# Patient Record
Sex: Male | Born: 1975 | Race: Black or African American | Hispanic: No | Marital: Married | Smoking: Former smoker
Health system: Southern US, Community
[De-identification: ages and names within clinical notes are randomized; demographics above are authoritative.]

## PROBLEM LIST (undated history)

## (undated) DIAGNOSIS — I10 Essential (primary) hypertension: Secondary | ICD-10-CM

## (undated) DIAGNOSIS — I509 Heart failure, unspecified: Secondary | ICD-10-CM

## (undated) DIAGNOSIS — E785 Hyperlipidemia, unspecified: Secondary | ICD-10-CM

## (undated) DIAGNOSIS — I4891 Unspecified atrial fibrillation: Secondary | ICD-10-CM

## (undated) HISTORY — DX: Essential (primary) hypertension: I10

## (undated) HISTORY — PX: NO PAST SURGERIES: SHX2092

---

## 2014-02-27 ENCOUNTER — Encounter (HOSPITAL_COMMUNITY): Payer: Self-pay

## 2014-02-27 ENCOUNTER — Emergency Department (HOSPITAL_COMMUNITY)
Admission: EM | Admit: 2014-02-27 | Discharge: 2014-02-27 | Discharge status: Against Medical Advice | Payer: BLUE CROSS/BLUE SHIELD | Attending: Emergency Medicine | Admitting: Emergency Medicine

## 2014-02-27 ENCOUNTER — Emergency Department (HOSPITAL_COMMUNITY): Payer: BLUE CROSS/BLUE SHIELD

## 2014-02-27 DIAGNOSIS — R040 Epistaxis: Secondary | ICD-10-CM | POA: Insufficient documentation

## 2014-02-27 DIAGNOSIS — R0789 Other chest pain: Secondary | ICD-10-CM | POA: Diagnosis not present

## 2014-02-27 DIAGNOSIS — Z720 Tobacco use: Secondary | ICD-10-CM | POA: Diagnosis not present

## 2014-02-27 DIAGNOSIS — R002 Palpitations: Secondary | ICD-10-CM

## 2014-02-27 DIAGNOSIS — R0602 Shortness of breath: Secondary | ICD-10-CM | POA: Diagnosis not present

## 2014-02-27 LAB — CBC
HCT: 45 % (ref 39.0–52.0)
HEMOGLOBIN: 15 g/dL (ref 13.0–17.0)
MCH: 29.4 pg (ref 26.0–34.0)
MCHC: 33.3 g/dL (ref 30.0–36.0)
MCV: 88.2 fL (ref 78.0–100.0)
Platelets: 271 10*3/uL (ref 150–400)
RBC: 5.1 MIL/uL (ref 4.22–5.81)
RDW: 14.3 % (ref 11.5–15.5)
WBC: 6.8 10*3/uL (ref 4.0–10.5)

## 2014-02-27 LAB — TROPONIN I: Troponin I: <0.03 ng/mL (ref <0.031)

## 2014-02-27 LAB — BASIC METABOLIC PANEL
Anion gap: 6 (ref 5–15)
BUN: 11 mg/dL (ref 6–23)
CALCIUM: 8.6 mg/dL (ref 8.4–10.5)
CO2: 24 mmol/L (ref 19–32)
CREATININE: 1.19 mg/dL (ref 0.50–1.35)
Chloride: 108 mEq/L (ref 96–112)
GFR calc Af Amer: 89 mL/min — ABNORMAL LOW (ref >90)
GFR, EST NON AFRICAN AMERICAN: 77 mL/min — AB (ref >90)
Glucose, Bld: 97 mg/dL (ref 70–99)
Potassium: 4.4 mmol/L (ref 3.5–5.1)
Sodium: 138 mmol/L (ref 135–145)

## 2014-02-27 NOTE — ED Provider Notes (Signed)
CSN: [id number]     Arrival date & time 02/27/14  1727 History   First MD Initiated Contact with Patient 02/27/14 1749     Chief Complaint  Patient presents with  . Palpitations     (Consider location/radiation/quality/duration/timing/severity/associated sxs/prior Treatment) HPI  39 year old male presents with palpitations that lasted about 45 minutes. This occurred shortly prior to arrival. Patient states that it felt like his heart was beating out of his chest and he had some associated chest pressure and shortness of breath. When the palpitations resolved his pain and shortness of breath resolved. He states he had woken up from a nap and went downstairs and was smoking a cigarette when he started. Patient does not know what made the palpitations go away. He has not had any recent long trips, surgeries, or leg swelling. Patient feels asymptomatic at this time. He states he has sodas occasionally but denies coffee or excessive caffeine use. No stressors. Patient has never had this before. After this was done he had a transient nose bleed that resolved on its own.  No past medical history on file. No past surgical history on file. No family history on file. History  Substance Use Topics  . Smoking status: Current Every Day Smoker  . Smokeless tobacco: Not on file  . Alcohol Use: Not on file    Review of Systems  HENT: Positive for nosebleeds.   Respiratory: Positive for shortness of breath.   Cardiovascular: Positive for chest pain and palpitations. Negative for leg swelling.  Gastrointestinal: Negative for nausea, vomiting, abdominal pain and diarrhea.      Allergies  Review of patient's allergies indicates no known allergies.  Home Medications   Prior to Admission medications   Medication Sig Start Date End Date Taking? Authorizing Provider  aspirin-acetaminophen-caffeine (EXCEDRIN MIGRAINE) [id number] MG per tablet Take 2 tablets by mouth every 6 (six) hours as needed for  headache or migraine.   Yes Historical Provider, MD  Aspirin-Acetaminophen-Caffeine (GOODY HEADACHE PO) Take 1 packet by mouth every 4 (four) hours as needed (for pain).   Yes Historical Provider, MD   BP 161/94 mmHg  Pulse 80  Temp(Src) 98.2 F (36.8 C) (Oral)  Resp 18  SpO2 100% Physical Exam  Constitutional: He is oriented to person, place, and time. He appears well-developed and well-nourished.  HENT:  Head: Normocephalic and atraumatic.  Right Ear: External ear normal.  Left Ear: External ear normal.  Nose: Nose normal. No epistaxis.  Eyes: Right eye exhibits no discharge. Left eye exhibits no discharge.  Neck: Neck supple.  Cardiovascular: Normal rate, regular rhythm, normal heart sounds and intact distal pulses.   No murmur heard. Pulmonary/Chest: Effort normal and breath sounds normal.  Abdominal: Soft. He exhibits no distension. There is no tenderness.  Musculoskeletal: He exhibits no edema or tenderness.  No leg swelling or tenderness  Neurological: He is alert and oriented to person, place, and time.  Skin: Skin is warm and dry.  Nursing note and vitals reviewed.   ED Course  Procedures (including critical care time) Labs Review Labs Reviewed  BASIC METABOLIC PANEL - Abnormal; Notable for the following:    GFR calc non Af Amer 77 (*)    GFR calc Af Amer 89 (*)    All other components within normal limits  CBC  TROPONIN I  TROPONIN I    Imaging Review Dg Chest Port 1 View  02/27/2014   CLINICAL DATA:  Palpitation.  Anterior chest pain.  EXAM: PORTABLE CHEST -  1 VIEW  COMPARISON:  None.  FINDINGS: Multiple monitoring leads overlie the patient. Normal cardiac and mediastinal contours. No consolidative pulmonary opacity. No pleural effusion or pneumothorax. Regional skeleton is unremarkable.  IMPRESSION: No acute cardiopulmonary process.   Electronically Signed   By: Lovey Newcomer M.D.   On: 02/27/2014 19:30     EKG Interpretation   Date/Time:  Sunday February 27 2014 17:35:00 EST Ventricular Rate:  78 PR Interval:  142 QRS Duration: 97 QT Interval:  391 QTC Calculation: 445 R Axis:   57 Text Interpretation:  Normal sinus rhythm ST elev, probable normal early  repol pattern No old tracing to compare Confirmed by [place]  ([id number]) on 02/27/2014 5:53:05 PM      MDM   Final diagnoses:  Palpitations    Patient with transient palpitations and associated shortness of breath and chest pain. Patient is low risk for ACS and this seems to be more palpitation related, possibly from nicotine from smoking. Initial EKG is benign. No events on telemetry. Suggested second troponin, which patient agrees to. At this point it is negative. When I went to go tell him his results he and his family were not in the room and apparently had left the ER.    Ephraim Hamburger, MD 02/27/14 2325

## 2014-02-27 NOTE — ED Notes (Signed)
Pt reports that he started to have palpitations approx 30-45 PTA after waking from a nap. Pt sts that he experienced CP, SOB and was nauseous and "could feel my heartbeat." Pt is A&O and in NAD. PT HR 81 and NS on monitor

## 2014-02-27 NOTE — ED Notes (Signed)
When going back to provider and myself to assess patient. Patient left AMA and room was cleaned. Pt left while nurse was in another room. Didn't physically see patient ambulate out of room. Last time patient was seen, he was in no distress and resting quietly.

## 2014-02-27 NOTE — ED Notes (Signed)
He states that shortly after awakening from a nap this afternoon, as he was comfortably sitting and smoking a cigarette, he experienced palpitations with some shortness of breath, which lasted about an hour and which is "better now".  His skin is normal, warm and dry and he is breathing normally.  EKG performed at triage.

## 2014-03-07 ENCOUNTER — Emergency Department (HOSPITAL_COMMUNITY)
Admission: EM | Admit: 2014-03-07 | Discharge: 2014-03-07 | Disposition: A | Discharge status: Home / Self Care | Payer: BLUE CROSS/BLUE SHIELD | Attending: Emergency Medicine | Admitting: Emergency Medicine

## 2014-03-07 ENCOUNTER — Emergency Department (HOSPITAL_COMMUNITY): Payer: BLUE CROSS/BLUE SHIELD

## 2014-03-07 ENCOUNTER — Encounter (HOSPITAL_COMMUNITY): Payer: Self-pay | Admitting: Family Medicine

## 2014-03-07 DIAGNOSIS — R002 Palpitations: Secondary | ICD-10-CM | POA: Insufficient documentation

## 2014-03-07 DIAGNOSIS — Z7982 Long term (current) use of aspirin: Secondary | ICD-10-CM | POA: Insufficient documentation

## 2014-03-07 DIAGNOSIS — Z720 Tobacco use: Secondary | ICD-10-CM | POA: Insufficient documentation

## 2014-03-07 DIAGNOSIS — R079 Chest pain, unspecified: Secondary | ICD-10-CM | POA: Diagnosis present

## 2014-03-07 LAB — CBC
HCT: 43.1 % (ref 39.0–52.0)
Hemoglobin: 14.5 g/dL (ref 13.0–17.0)
MCH: 29.1 pg (ref 26.0–34.0)
MCHC: 33.6 g/dL (ref 30.0–36.0)
MCV: 86.5 fL (ref 78.0–100.0)
Platelets: 290 K/uL (ref 150–400)
RBC: 4.98 MIL/uL (ref 4.22–5.81)
RDW: 14.3 % (ref 11.5–15.5)
WBC: 5.5 K/uL (ref 4.0–10.5)

## 2014-03-07 LAB — I-STAT TROPONIN, ED: Troponin i, poc: 0 ng/mL (ref 0.00–0.08)

## 2014-03-07 LAB — BASIC METABOLIC PANEL WITH GFR
Anion gap: 7 (ref 5–15)
BUN: 7 mg/dL (ref 6–23)
CO2: 27 mmol/L (ref 19–32)
Calcium: 8.9 mg/dL (ref 8.4–10.5)
Chloride: 106 meq/L (ref 96–112)
Creatinine, Ser: 1.1 mg/dL (ref 0.50–1.35)
GFR calc Af Amer: >90 mL/min (ref >90)
GFR calc non Af Amer: 84 mL/min — ABNORMAL LOW (ref >90)
Glucose, Bld: 91 mg/dL (ref 70–99)
Potassium: 4.2 mmol/L (ref 3.5–5.1)
Sodium: 140 mmol/L (ref 135–145)

## 2014-03-07 NOTE — ED Notes (Signed)
Per pt sts he has been having episodes of palpitations and SOB over the past week. Denies cough, fever, sickness. Denies pain.

## 2014-03-07 NOTE — ED Provider Notes (Signed)
CSN: [id number]     Arrival date & time 03/07/14  1059 History   First MD Initiated Contact with Patient 03/07/14 1131     Chief Complaint  Patient presents with  . Chest Pain  . Shortness of Breath     (Consider location/radiation/quality/duration/timing/severity/associated sxs/prior Treatment) HPI Devin [name] is a 39 year old male with no known past medical history who presents to ER with an episode of palpitations for approximately 45 minutes. Patient states the onset was shortly prior to coming to the ER. Patient states he was at work, [employer] papers when his palpitations began suddenly. Patient describes some associated shortness of breath with his palpitations, however after his episode of 45 minutes, patient states his shortness of breath and palpitations subsided spontaneously. Patient denied having any chest discomfort, lightheadedness, dizziness, blurred vision, headache, nausea, vomiting, abdominal pain. Patient states he's had 2 other episodes of palpitations similar to this, and was seen and evaluated on 02/27/14 for same with negative workup. Patient states he is a current every day smoker, and has family history significant for his aunt and uncle who had heart problems.   History reviewed. No pertinent past medical history. History reviewed. No pertinent past surgical history. History reviewed. No pertinent family history. History  Substance Use Topics  . Smoking status: Current Every Day Smoker  . Smokeless tobacco: Not on file  . Alcohol Use: Yes    Review of Systems  Constitutional: Negative for fever.  HENT: Negative for trouble swallowing.   Eyes: Negative for visual disturbance.  Respiratory: Positive for shortness of breath.   Cardiovascular: Positive for palpitations. Negative for chest pain.  Gastrointestinal: Negative for nausea, vomiting and abdominal pain.  Genitourinary: Negative for dysuria.  Musculoskeletal: Negative for neck pain.  Skin: Negative for  rash.  Neurological: Negative for dizziness, weakness and numbness.  Psychiatric/Behavioral: Negative.       Allergies  Review of patient's allergies indicates no known allergies.  Home Medications   Prior to Admission medications   Medication Sig Start Date End Date Taking? Authorizing Provider  aspirin-acetaminophen-caffeine (EXCEDRIN MIGRAINE) [phone] MG per tablet Take 2 tablets by mouth every 6 (six) hours as needed for headache or migraine.   Yes Historical Provider, MD  Aspirin-Acetaminophen-Caffeine (GOODY HEADACHE PO) Take 1 packet by mouth every 4 (four) hours as needed (for pain).   Yes Historical Provider, MD   BP 138/78 mmHg  Pulse 60  Temp(Src) 98.6 F (37 C)  Resp 21  Wt 189 lb (85.73 kg)  SpO2 100% Physical Exam  Constitutional: He is oriented to person, place, and time. He appears well-developed and well-nourished. No distress.  HENT:  Head: Normocephalic and atraumatic.  Mouth/Throat: Oropharynx is clear and moist. No oropharyngeal exudate.  Eyes: Right eye exhibits no discharge. Left eye exhibits no discharge. No scleral icterus.  Neck: Normal range of motion.  Cardiovascular: Normal rate, regular rhythm, S1 normal, S2 normal and normal heart sounds.   No murmur heard. Pulses:      Radial pulses are 2+ on the right side, and 2+ on the left side.       Posterior tibial pulses are 2+ on the right side, and 2+ on the left side.  Pulmonary/Chest: Effort normal and breath sounds normal. No respiratory distress.  Abdominal: Soft. There is no tenderness.  Musculoskeletal: Normal range of motion. He exhibits no edema or tenderness.  Neurological: He is alert and oriented to person, place, and time. No cranial nerve deficit. Coordination normal.  Skin: Skin is  warm and dry. No rash noted. He is not diaphoretic.  Psychiatric: He has a normal mood and affect.  Nursing note and vitals reviewed.   ED Course  Procedures (including critical care time) Labs  Review Labs Reviewed  BASIC METABOLIC PANEL - Abnormal; Notable for the following:    GFR calc non Af Amer 84 (*)    All other components within normal limits  CBC  I-STAT TROPOININ, ED  Randolm Idol, ED    Imaging Review Dg Chest 2 View  03/07/2014   CLINICAL DATA:  Subsequent encounter for 2 weeks history of palpitations and intermittent shortness of breath.  EXAM: CHEST  2 VIEW  COMPARISON:  02/27/2014  FINDINGS: The heart size and mediastinal contours are within normal limits. Both lungs are clear. The visualized skeletal structures are unremarkable.  IMPRESSION: Stable exam.  No acute cardiopulmonary findings.   Electronically Signed   By: Misty Stanley M.D.   On: 03/07/2014 14:16     EKG Interpretation None      MDM   Final diagnoses:  Palpitations   Patient here with complaint of palpitations approximately lasting for 45 minutes just prior to arriving to the ER. On arrival to the ER, patient is asymptomatic. Patient states he feels some mild light palpitations while in the ER here. Reviewing patient's telemetry, and several PACs noted correlated with when patient is feeling palpitations. No signs of other dysrhythmia. Recommended delta troponin check at 1430, however patient states he preferred to leave. Patient states he would prefer not to have a delta troponin check. I discussed risks and benefits of having delta troponin, patient verbalizes understanding of risks of refusing. Palpitations not likely of cardiac or pulmonary etiology d/t presentation, perc negative, VSS, no tracheal deviation, no JVD or new murmur, RRR, breath sounds equal bilaterally, EKG without acute abnormalities, negative troponin, and negative CXR. Pt has been advised return to the ED is palpitations become exertional, associated with chest pain, light-headedness, SOB, diaphoresis or nausea, radiates to left jaw/arm, worsens or becomes concerning in any way. I discussed these return precautions with  patient, and strongly recommended to follow-up with cardiology. Patient verbalizes understanding and agreement of this plan. I encouraged patient to call or return to the ER should he have any questions or concerns.  BP 138/78 mmHg  Pulse 60  Temp(Src) 98.6 F (37 C)  Resp 21  Wt 189 lb (85.73 kg)  SpO2 100%  Signed,  Dahlia Bailiff, PA-C 5:18 PM  Patient discussed with Dr. Veryl Speak, MD     Carrie Mew, PA-C 03/07/14 [place], MD 03/08/14 [phone]

## 2014-03-07 NOTE — Discharge Instructions (Signed)

## 2014-03-30 ENCOUNTER — Ambulatory Visit: Payer: BLUE CROSS/BLUE SHIELD | Admitting: Cardiology

## 2014-12-15 DIAGNOSIS — I10 Essential (primary) hypertension: Secondary | ICD-10-CM | POA: Insufficient documentation

## 2014-12-16 DIAGNOSIS — E782 Mixed hyperlipidemia: Secondary | ICD-10-CM | POA: Insufficient documentation

## 2014-12-16 DIAGNOSIS — R945 Abnormal results of liver function studies: Secondary | ICD-10-CM

## 2014-12-16 DIAGNOSIS — R7989 Other specified abnormal findings of blood chemistry: Secondary | ICD-10-CM | POA: Insufficient documentation

## 2016-02-02 DIAGNOSIS — Z0000 Encounter for general adult medical examination without abnormal findings: Secondary | ICD-10-CM | POA: Insufficient documentation

## 2016-07-16 ENCOUNTER — Encounter: Payer: Self-pay | Admitting: Family Medicine

## 2016-07-16 ENCOUNTER — Ambulatory Visit (INDEPENDENT_AMBULATORY_CARE_PROVIDER_SITE_OTHER): Payer: Commercial Managed Care - PPO | Admitting: Family Medicine

## 2016-07-16 VITALS — BP 114/80 | HR 72 | Temp 98.7°F | Resp 18 | Wt 189.0 lb

## 2016-07-16 DIAGNOSIS — R1310 Dysphagia, unspecified: Secondary | ICD-10-CM

## 2016-07-16 DIAGNOSIS — M542 Cervicalgia: Secondary | ICD-10-CM | POA: Diagnosis not present

## 2016-07-16 DIAGNOSIS — I10 Essential (primary) hypertension: Secondary | ICD-10-CM | POA: Diagnosis not present

## 2016-07-16 DIAGNOSIS — F17200 Nicotine dependence, unspecified, uncomplicated: Secondary | ICD-10-CM | POA: Diagnosis not present

## 2016-07-16 LAB — POCT RAPID STREP A (OFFICE): RAPID STREP A SCREEN: NEGATIVE

## 2016-07-16 NOTE — Progress Notes (Signed)
  Chief Complaint  Patient presents with  . Neck Pain    x 5 days     HPI  Neck Pain and smoking Pt states that 5 days ago he had ventrolateral neck pain out of the blue He reports that there is pain with swallowing No fevers or chills No ear pain No cough He smokes 1/2 pack a day  Hypertension Pt with history of hypertension Takes coreg and lisinopril Reports that he has stable bp Denies cp, palpitations, sob No side effects of medications  Past Medical History:  Diagnosis Date  . Hypertension     Current Outpatient Prescriptions  Medication Sig Dispense Refill  . carvedilol (COREG) 6.25 MG tablet Take 5 mg by mouth 1 day or 1 dose.  2  . lisinopril (PRINIVIL,ZESTRIL) 5 MG tablet Take 5 mg by mouth 1 day or 1 dose.  3  . aspirin-acetaminophen-caffeine (EXCEDRIN MIGRAINE) [phone] MG per tablet Take 2 tablets by mouth every 6 (six) hours as needed for headache or migraine.    . Aspirin-Acetaminophen-Caffeine (GOODY HEADACHE PO) Take 1 packet by mouth every 4 (four) hours as needed (for pain).     No current facility-administered medications for this visit.     Allergies: No Known Allergies  No past surgical history on file.  Social History   Social History  . Marital status: Married    Spouse name: N/A  . Number of children: N/A  . Years of education: N/A   Social History Main Topics  . Smoking status: Current Every Day Smoker  . Smokeless tobacco: Never Used  . Alcohol use Yes  . Drug use: Unknown  . Sexual activity: Not Asked   Other Topics Concern  . None   Social History Narrative  . None    ROS See hpi  Objective: Vitals:   07/16/16 1619  BP: 114/80  Pulse: 72  Resp: 18  Temp: 98.7 F (37.1 C)  TempSrc: Oral  SpO2: 97%  Weight: 189 lb (85.7 kg)    Physical Exam  Constitutional: He is oriented to person, place, and time. He appears well-developed and well-nourished.  HENT:  Head: Normocephalic.  Left Ear: External ear normal.    Nose: Nose normal.  Mouth/Throat: Uvula is midline and oropharynx is clear and moist. No oropharyngeal exudate, posterior oropharyngeal edema, posterior oropharyngeal erythema or tonsillar abscesses.  Eyes: Conjunctivae and EOM are normal.  Neck: Trachea normal, normal range of motion, full passive range of motion without pain and phonation normal. Neck supple. Normal carotid pulses present. No tracheal tenderness, no spinous process tenderness and no muscular tenderness present. Carotid bruit is not present. No neck rigidity. No tracheal deviation and normal range of motion present. No thyroid mass and no thyromegaly present.    Cardiovascular: Normal rate, regular rhythm and normal heart sounds.   No murmur heard. Pulmonary/Chest: Effort normal and breath sounds normal. No stridor. No respiratory distress. He has no wheezes. He has no rales.  Neurological: He is alert and oriented to person, place, and time.    Assessment and Plan Alexi was seen today for neck pain.  Diagnoses and all orders for this visit:  Anterior neck pain- will get [place] as there is no explanation of his symptoms by physical exam -     US Soft Tissue Head/Neck; Future  Tobacco use disorder- discussed smoking cessation    Hypertension- bp controlled by diet Smoking cessation advised   Zoe A Nolon Rod

## 2016-07-16 NOTE — Patient Instructions (Signed)
     IF you received an x-ray today, you will receive an invoice from [facility]. Please contact [facility] at [phone] with questions or concerns regarding your invoice.   IF you received labwork today, you will receive an invoice from [facility]. Please contact [facility] at [phone] with questions or concerns regarding your invoice.   Our billing staff will not be able to assist you with questions regarding bills from these companies.  You will be contacted with the lab results as soon as they are available. The fastest way to get your results is to activate your My Chart account. Instructions are located on the last page of this paperwork. If you have not heard from us regarding the results in 2 weeks, please contact this office.     

## 2016-07-17 ENCOUNTER — Telehealth: Payer: Self-pay | Admitting: Family Medicine

## 2016-07-17 ENCOUNTER — Encounter: Payer: Self-pay | Admitting: Family Medicine

## 2016-07-17 ENCOUNTER — Ambulatory Visit
Admission: RE | Admit: 2016-07-17 | Discharge: 2016-07-17 | Disposition: A | Discharge status: Home / Self Care | Payer: BLUE CROSS/BLUE SHIELD | Source: Ambulatory Visit | Attending: Family Medicine | Admitting: Family Medicine

## 2016-07-17 DIAGNOSIS — M542 Cervicalgia: Secondary | ICD-10-CM

## 2016-07-17 MED ORDER — IBUPROFEN 800 MG PO TABS: 800.0000 mg | ORAL_TABLET | Freq: Three times a day (TID) | ORAL | 0 refills | Status: DC | PRN

## 2016-07-17 NOTE — Telephone Encounter (Signed)
Discussed US findings of only a lymph node Advised pt to take ibuprofen for pain  He was wondering about an antibiotic He was told that at this point there is no targeted infection to treast Sent in ibuprofen 800mg  tid Pt to take the ibuprofen and follow up with a call and if no improvement will refer to ENT Discussed how lymph nodes work Advise pt to stop smoking

## 2016-08-08 DIAGNOSIS — Q141 Congenital malformation of retina: Secondary | ICD-10-CM | POA: Diagnosis not present

## 2016-08-08 DIAGNOSIS — D4981 Neoplasm of unspecified behavior of retina and choroid: Secondary | ICD-10-CM | POA: Diagnosis not present

## 2016-10-24 ENCOUNTER — Encounter (HOSPITAL_COMMUNITY): Payer: Self-pay | Admitting: Emergency Medicine

## 2016-10-24 ENCOUNTER — Emergency Department (HOSPITAL_COMMUNITY)
Admission: EM | Admit: 2016-10-24 | Discharge: 2016-10-24 | Disposition: A | Discharge status: Home / Self Care | Payer: Commercial Managed Care - PPO | Attending: Emergency Medicine | Admitting: Emergency Medicine

## 2016-10-24 DIAGNOSIS — Z5321 Procedure and treatment not carried out due to patient leaving prior to being seen by health care provider: Secondary | ICD-10-CM | POA: Insufficient documentation

## 2016-10-24 DIAGNOSIS — R51 Headache: Secondary | ICD-10-CM | POA: Insufficient documentation

## 2016-10-24 NOTE — ED Triage Notes (Addendum)
Pt reports bilateral blurred vision that started this am upon awakening. Pt also reports having a HA that gets worse with bright lights. Has had similar symptoms before, but went away quickly.  Hx of HTN, but has not taken BP medications in 3 months.

## 2016-10-30 DIAGNOSIS — Z23 Encounter for immunization: Secondary | ICD-10-CM | POA: Diagnosis not present

## 2016-11-20 ENCOUNTER — Telehealth: Payer: Self-pay | Admitting: Behavioral Health

## 2016-11-20 NOTE — Telephone Encounter (Signed)
Called the number listed in chart & told that this number is incorrect.

## 2016-11-21 ENCOUNTER — Encounter: Payer: Self-pay | Admitting: Family Medicine

## 2016-11-21 ENCOUNTER — Ambulatory Visit (INDEPENDENT_AMBULATORY_CARE_PROVIDER_SITE_OTHER): Payer: Commercial Managed Care - PPO | Admitting: Family Medicine

## 2016-11-21 VITALS — BP 128/88 | HR 91 | Temp 98.9°F | Ht 72.0 in | Wt 180.4 lb

## 2016-11-21 DIAGNOSIS — R5383 Other fatigue: Secondary | ICD-10-CM

## 2016-11-21 DIAGNOSIS — Z114 Encounter for screening for human immunodeficiency virus [HIV]: Secondary | ICD-10-CM

## 2016-11-21 DIAGNOSIS — I10 Essential (primary) hypertension: Secondary | ICD-10-CM

## 2016-11-21 DIAGNOSIS — R42 Dizziness and giddiness: Secondary | ICD-10-CM | POA: Diagnosis not present

## 2016-11-21 LAB — COMPREHENSIVE METABOLIC PANEL
ALT: 63 U/L — AB (ref 0–53)
AST: 28 U/L (ref 0–37)
Albumin: 4.2 g/dL (ref 3.5–5.2)
Alkaline Phosphatase: 114 U/L (ref 39–117)
BILIRUBIN TOTAL: 0.8 mg/dL (ref 0.2–1.2)
BUN: 9 mg/dL (ref 6–23)
CALCIUM: 9.4 mg/dL (ref 8.4–10.5)
CHLORIDE: 104 meq/L (ref 96–112)
CO2: 29 meq/L (ref 19–32)
Creatinine, Ser: 1.22 mg/dL (ref 0.40–1.50)
GFR: 84.7 mL/min (ref >60.00)
Glucose, Bld: 98 mg/dL (ref 70–99)
Potassium: 5.4 mEq/L — ABNORMAL HIGH (ref 3.5–5.1)
Sodium: 137 mEq/L (ref 135–145)
Total Protein: 7.3 g/dL (ref 6.0–8.3)

## 2016-11-21 LAB — CBC
HCT: 52.5 % — ABNORMAL HIGH (ref 39.0–52.0)
HEMOGLOBIN: 16.8 g/dL (ref 13.0–17.0)
MCHC: 32 g/dL (ref 30.0–36.0)
MCV: 89.1 fl (ref 78.0–100.0)
PLATELETS: 281 10*3/uL (ref 150.0–400.0)
RBC: 5.89 Mil/uL — ABNORMAL HIGH (ref 4.22–5.81)
RDW: 13.9 % (ref 11.5–15.5)
WBC: 5.4 10*3/uL (ref 4.0–10.5)

## 2016-11-21 LAB — LIPID PANEL
CHOLESTEROL: 182 mg/dL (ref 0–200)
HDL: 42.7 mg/dL (ref >39.00)
LDL CALC: 111 mg/dL — AB (ref 0–99)
NonHDL: 138.81
TRIGLYCERIDES: 140 mg/dL (ref 0.0–149.0)
Total CHOL/HDL Ratio: 4
VLDL: 28 mg/dL (ref 0.0–40.0)

## 2016-11-21 LAB — TSH: TSH: <0.01 u[IU]/mL — ABNORMAL LOW (ref 0.35–4.50)

## 2016-11-21 LAB — HEMOGLOBIN A1C: Hgb A1c MFr Bld: 5.7 % (ref 4.6–6.5)

## 2016-11-21 MED ORDER — LISINOPRIL 5 MG PO TABS: 5.0000 mg | ORAL_TABLET | Freq: Every day | ORAL | 3 refills | Status: DC

## 2016-11-21 NOTE — Patient Instructions (Signed)
Give [place] 2-3 business days to get the results of your labs back. If labs are normal, you will likely receive a letter in the mail unless you have MyChart. This can take longer than 2-3 business days.   Aim to do some physical exertion for 150 minutes per week. This is typically divided into 5 days per week, 30 minutes per day. The activity should be enough to get your heart rate up. Anything is better than nothing if you have time constraints.  Stop smoking. Return to the office if you would like some help quitting.  Healthy Eating Plan Many factors influence your heart health, including eating and exercise habits. Heart (coronary) risk increases with abnormal blood fat (lipid) levels. Heart-healthy meal planning includes limiting unhealthy fats, increasing healthy fats, and making other small dietary changes. This includes maintaining a healthy body weight to help keep lipid levels within a normal range.  WHAT IS MY PLAN?  Your health care provider recommends that you:  Drink a glass of water before meals to help with satiety.  Eat slowly.  An alternative to the water is to add Metamucil. This will help with satiety as well. It does contain calories, unlike water.  WHAT TYPES OF FAT SHOULD I CHOOSE?  Choose healthy fats more often. Choose monounsaturated and polyunsaturated fats, such as olive oil and canola oil, flaxseeds, walnuts, almonds, and seeds.  Eat more omega-3 fats. Good choices include salmon, mackerel, sardines, tuna, flaxseed oil, and ground flaxseeds. Aim to eat fish at least two times each week.  Avoid foods with partially hydrogenated oils in them. These contain trans fats. Examples of foods that contain trans fats are stick margarine, some tub margarines, cookies, crackers, and other baked goods. If you are going to avoid a fat, this is the one to avoid!  WHAT GENERAL GUIDELINES DO I NEED TO FOLLOW?  Check food labels carefully to identify foods with trans fats. Avoid  these types of options when possible.  Fill one half of your plate with vegetables and green salads. Eat 4-5 servings of vegetables per day. A serving of vegetables equals 1 cup of raw leafy vegetables,  cup of raw or cooked cut-up vegetables, or  cup of vegetable juice.  Fill one fourth of your plate with whole grains. Look for the word "whole" as the first word in the ingredient list.  Fill one fourth of your plate with lean protein foods.  Eat 4-5 servings of fruit per day. A serving of fruit equals one medium whole fruit,  cup of dried fruit,  cup of fresh, frozen, or canned fruit. Try to avoid fruits in cups/syrups as the sugar content can be high.  Eat more foods that contain soluble fiber. Examples of foods that contain this type of fiber are apples, broccoli, carrots, beans, peas, and barley. Aim to get 20-30 g of fiber per day.  Eat more home-cooked food and less restaurant, buffet, and fast food.  Limit or avoid alcohol.  Limit foods that are high in starch and sugar.  Avoid fried foods when able.  Cook foods by using methods other than frying. Baking, boiling, grilling, and broiling are all great options. Other fat-reducing suggestions include: ? Removing the skin from poultry. ? Removing all visible fats from meats. ? Skimming the fat off of stews, soups, and gravies before serving them. ? Steaming vegetables in water or broth.  Lose weight if you are overweight. Losing just 5-10% of your initial body weight can help your  overall health and prevent diseases such as diabetes and heart disease.  Increase your consumption of nuts, legumes, and seeds to 4-5 servings per week. One serving of dried beans or legumes equals  cup after being cooked, one serving of nuts equals 1 ounces, and one serving of seeds equals  ounce or 1 tablespoon.  WHAT ARE GOOD FOODS CAN I EAT? Grains Grainy breads (try to find bread that is 3 g of fiber per slice or greater), oatmeal, light  popcorn. Whole-grain cereals. Rice and pasta, including brown rice and those that are made with whole wheat. Edamame pasta is a great alternative to grain pasta. It has a higher protein content. Try to avoid significant consumption of white bread, sugary cereals, or pastries/baked goods.  Vegetables All vegetables. Cooked white potatoes do not count as vegetables.  Fruits All fruits, but limit pineapple and bananas as these fruits have a higher sugar content.  Meats and Other Protein Sources Lean, well-trimmed beef, veal, pork, and lamb. Chicken and [place] without skin. All fish and shellfish. Wild duck, rabbit, pheasant, and venison. Egg whites or low-cholesterol egg substitutes. Dried beans, peas, lentils, and tofu.Seeds and most nuts.  Dairy Low-fat or nonfat cheeses, including ricotta, string, and mozzarella. Skim or 1% milk that is liquid, powdered, or evaporated. Buttermilk that is made with low-fat milk. Nonfat or low-fat yogurt. Soy/Almond milk are good alternatives if you cannot handle dairy.  Beverages Water is the best for you. Sports drinks with less sugar are more desirable unless you are a highly active athlete.  Sweets and Desserts Sherbets and fruit ices. Honey, jam, marmalade, jelly, and syrups. Dark chocolate.  Eat all sweets and desserts in moderation.  Fats and Oils Nonhydrogenated (trans-free) margarines. Vegetable oils, including soybean, sesame, sunflower, olive, peanut, safflower, corn, canola, and cottonseed. Salad dressings or mayonnaise that are made with a vegetable oil. Limit added fats and oils that you use for cooking, baking, salads, and as spreads.  Other Cocoa powder. Coffee and tea. Most condiments.  The items listed above may not be a complete list of recommended foods or beverages. Contact your dietitian for more options.

## 2016-11-21 NOTE — Progress Notes (Signed)
Pre visit review using our clinic review tool, if applicable. No additional management support is needed unless otherwise documented below in the visit note. 

## 2016-11-21 NOTE — Progress Notes (Signed)
Chief Complaint  Patient presents with  . Establish Care       New Patient Visit SUBJECTIVE: HPI: Devin [name]. is an 41 y.o.male who is being seen for establishing care.  The patient was previously seen at [facility].   2 weeks ago, light-headed and vision changes. Famhx of DM, concerned he may have this also. No issue since then, he does follow with an eye doctor, f/u is coming up or he just missed it. +fatigue, no fevers, HA, change in PO intake, increased thirst/urination.   Diet is healthy. Walking. He does not check his BP's at home. Takes Lisinopril 5 mg daily, stopped taking Coreg which was rx'd for palpitations. He has not had any of those for quite some time.    No Known Allergies  Past Medical History:  Diagnosis Date  . Hypertension    Past Surgical History:  Procedure Laterality Date  . NO PAST SURGERIES       Social History   Social History  . Marital status: Married   Social History Main Topics  . Smoking status: Current Every Day Smoker  . Smokeless tobacco: Never Used  . Alcohol use Yes  . Drug use: No   Family History  Problem Relation Age of Onset  . Diabetes Mother   . Hypertension Mother   . Hypertension Father   . Hypertension Sister   . Asthma Son   . Cancer Maternal Grandfather   . Cancer Paternal Grandmother        cancer  . Heart attack Paternal Grandfather      Current Outpatient Prescriptions:  .  lisinopril (PRINIVIL,ZESTRIL) 5 MG tablet, Take 1 tablet (5 mg total) by mouth daily., Disp: 90 tablet, Rfl: 3 .  aspirin-acetaminophen-caffeine (EXCEDRIN MIGRAINE) [phone] MG per tablet, Take 2 tablets by mouth every 6 (six) hours as needed for headache or migraine., Disp: , Rfl:   ROS Cardiovascular: Denies chest pain  Respiratory: Denies dyspnea   OBJECTIVE: BP 128/88 (BP Location: Left Arm, Patient Position: Sitting, Cuff Size: Large)   Pulse 91   Temp 98.9 F (37.2 C) (Oral)   Ht 6' (1.829 m)   Wt 180 lb 6 oz (81.8 kg)    SpO2 97%   BMI 24.46 kg/m   Constitutional: -  VS reviewed -  Well developed, well nourished, appears stated age -  No apparent distress  Psychiatric: -  Oriented to person, place, and time -  Memory intact -  Affect and mood normal -  Fluent conversation, good eye contact -  Judgment and insight age appropriate  Eye: -  Conjunctivae clear, no discharge -  Pupils symmetric, round, reactive to light  ENMT: -  MMM    Pharynx moist, no exudate, no erythema  Neck: -  No gross swelling, no palpable masses -  Thyroid midline, not enlarged, mobile, no palpable masses  Cardiovascular: -  RRR -  No LE edema  Respiratory: -  Normal respiratory effort, no accessory muscle use, no retraction -  Breath sounds equal, no wheezes, no ronchi, no crackles  Musculoskeletal: -  No clubbing, no cyanosis -  Gait normal  Skin: -  No significant lesion on inspection -  Warm and dry to palpation   ASSESSMENT/PLAN: Essential hypertension - Plan: lisinopril (PRINIVIL,ZESTRIL) 5 MG tablet, Comprehensive metabolic panel, Lipid panel  Fatigue, unspecified type - Plan: Hemoglobin A1c, CBC, TSH  Light headedness - Plan: Hemoglobin A1c  Screening for HIV (human immunodeficiency virus) - Plan: HIV antibody  OK to cont lisinopril and stop Coreg given lack of palpitations and control over BP. Counseled on diet and exercise. Healthy diet handout given. I think if he improved on these things, he could come off of his very low dose of lisinopril. Patient should return 3 mo pending above orders. The patient voiced understanding and agreement to the plan.   Camp, DO 11/21/16  9:31 AM

## 2016-11-22 ENCOUNTER — Other Ambulatory Visit: Payer: Self-pay | Admitting: Family Medicine

## 2016-11-22 DIAGNOSIS — R7401 Elevation of levels of liver transaminase levels: Secondary | ICD-10-CM

## 2016-11-22 DIAGNOSIS — R7989 Other specified abnormal findings of blood chemistry: Secondary | ICD-10-CM

## 2016-11-22 DIAGNOSIS — R740 Nonspecific elevation of levels of transaminase and lactic acid dehydrogenase [LDH]: Secondary | ICD-10-CM

## 2016-11-22 DIAGNOSIS — E875 Hyperkalemia: Secondary | ICD-10-CM

## 2016-11-22 DIAGNOSIS — R718 Other abnormality of red blood cells: Secondary | ICD-10-CM

## 2016-11-22 LAB — HIV ANTIBODY (ROUTINE TESTING W REFLEX): HIV: NONREACTIVE

## 2016-11-22 NOTE — Progress Notes (Signed)
Future orders placed, see result note.

## 2016-12-02 ENCOUNTER — Telehealth: Payer: Self-pay | Admitting: Family Medicine

## 2016-12-02 NOTE — Telephone Encounter (Signed)
Devin [name] was returning your call.

## 2016-12-02 NOTE — Telephone Encounter (Signed)
See result notes. 

## 2016-12-06 ENCOUNTER — Other Ambulatory Visit (INDEPENDENT_AMBULATORY_CARE_PROVIDER_SITE_OTHER): Payer: Commercial Managed Care - PPO

## 2016-12-06 DIAGNOSIS — R740 Nonspecific elevation of levels of transaminase and lactic acid dehydrogenase [LDH]: Secondary | ICD-10-CM | POA: Diagnosis not present

## 2016-12-06 DIAGNOSIS — E875 Hyperkalemia: Secondary | ICD-10-CM

## 2016-12-06 DIAGNOSIS — R718 Other abnormality of red blood cells: Secondary | ICD-10-CM | POA: Diagnosis not present

## 2016-12-06 DIAGNOSIS — R7989 Other specified abnormal findings of blood chemistry: Secondary | ICD-10-CM

## 2016-12-06 DIAGNOSIS — R7401 Elevation of levels of liver transaminase levels: Secondary | ICD-10-CM

## 2016-12-06 NOTE — Addendum Note (Signed)
Addended by: Caffie Pinto on: 12/06/2016 03:59 PM   Modules accepted: Orders

## 2016-12-07 LAB — COMPREHENSIVE METABOLIC PANEL
AG Ratio: 1.4 (calc) (ref 1.0–2.5)
ALKALINE PHOSPHATASE (APISO): 125 U/L — AB (ref 40–115)
ALT: 81 U/L — ABNORMAL HIGH (ref 9–46)
AST: 44 U/L — AB (ref 10–40)
Albumin: 4.3 g/dL (ref 3.6–5.1)
BUN: 12 mg/dL (ref 7–25)
CALCIUM: 9.2 mg/dL (ref 8.6–10.3)
CHLORIDE: 106 mmol/L (ref 98–110)
CO2: 22 mmol/L (ref 20–32)
CREATININE: 1.16 mg/dL (ref 0.60–1.35)
GLOBULIN: 3 g/dL (ref 1.9–3.7)
Glucose, Bld: 92 mg/dL (ref 65–99)
Potassium: 4.6 mmol/L (ref 3.5–5.3)
Sodium: 141 mmol/L (ref 135–146)
Total Bilirubin: 0.5 mg/dL (ref 0.2–1.2)
Total Protein: 7.3 g/dL (ref 6.1–8.1)

## 2016-12-07 LAB — HEPATITIS C ANTIBODY
Hepatitis C Ab: NONREACTIVE
SIGNAL TO CUT-OFF: 0.02 (ref <1.00)

## 2016-12-07 LAB — IRON,TIBC AND FERRITIN PANEL
%SAT: 19 % (calc) (ref 15–60)
Ferritin: 232 ng/mL (ref 20–345)
IRON: 64 ug/dL (ref 50–180)
TIBC: 343 mcg/dL (calc) (ref 250–425)

## 2016-12-07 LAB — CBC
HEMATOCRIT: 48.3 % (ref 38.5–50.0)
Hemoglobin: 16.4 g/dL (ref 13.2–17.1)
MCH: 28.8 pg (ref 27.0–33.0)
MCHC: 34 g/dL (ref 32.0–36.0)
MCV: 84.7 fL (ref 80.0–100.0)
MPV: 11.3 fL (ref 7.5–12.5)
Platelets: 288 10*3/uL (ref 140–400)
RBC: 5.7 10*6/uL (ref 4.20–5.80)
RDW: 12.6 % (ref 11.0–15.0)
WBC: 6.7 10*3/uL (ref 3.8–10.8)

## 2016-12-07 LAB — T4, FREE: FREE T4: 1.2 ng/dL (ref 0.8–1.8)

## 2016-12-07 LAB — HEPATITIS B SURFACE ANTIGEN: HEP B S AG: NONREACTIVE

## 2016-12-07 LAB — TSH: TSH: 0.01 m[IU]/L — AB (ref 0.40–4.50)

## 2016-12-09 ENCOUNTER — Other Ambulatory Visit: Payer: Self-pay | Admitting: Family Medicine

## 2016-12-09 DIAGNOSIS — R7401 Elevation of levels of liver transaminase levels: Secondary | ICD-10-CM

## 2016-12-09 DIAGNOSIS — R740 Nonspecific elevation of levels of transaminase and lactic acid dehydrogenase [LDH]: Principal | ICD-10-CM

## 2016-12-09 DIAGNOSIS — R748 Abnormal levels of other serum enzymes: Secondary | ICD-10-CM

## 2017-01-20 ENCOUNTER — Telehealth: Payer: Self-pay | Admitting: *Deleted

## 2017-01-20 ENCOUNTER — Ambulatory Visit: Payer: Commercial Managed Care - PPO | Admitting: Family Medicine

## 2017-01-20 NOTE — Telephone Encounter (Signed)
[facility] / Dr Nani RavensJuluis Rainier  Copied from [place]. Topic: Quick Communication - Appointment Cancellation >> Jan 20, 2017  8:42 AM Darl Householder, RMA wrote: Patient called to cancel appointment scheduled for 01/20/2017 @ 10:15 am with Dr. Nani Ravens. Patient has not rescheduled their appointment.

## 2017-01-24 ENCOUNTER — Encounter: Payer: Commercial Managed Care - PPO | Admitting: Family Medicine

## 2017-01-25 ENCOUNTER — Emergency Department (HOSPITAL_BASED_OUTPATIENT_CLINIC_OR_DEPARTMENT_OTHER): Payer: Commercial Managed Care - PPO

## 2017-01-25 ENCOUNTER — Encounter (HOSPITAL_BASED_OUTPATIENT_CLINIC_OR_DEPARTMENT_OTHER): Payer: Self-pay | Admitting: Emergency Medicine

## 2017-01-25 ENCOUNTER — Other Ambulatory Visit: Payer: Self-pay

## 2017-01-25 ENCOUNTER — Inpatient Hospital Stay (HOSPITAL_BASED_OUTPATIENT_CLINIC_OR_DEPARTMENT_OTHER)
Admission: EM | Admit: 2017-01-25 | Discharge: 2017-01-28 | DRG: 308 | Disposition: A | Discharge status: Home / Self Care | Payer: Commercial Managed Care - PPO | Attending: Internal Medicine | Admitting: Internal Medicine

## 2017-01-25 DIAGNOSIS — I4892 Unspecified atrial flutter: Secondary | ICD-10-CM | POA: Diagnosis not present

## 2017-01-25 DIAGNOSIS — K2920 Alcoholic gastritis without bleeding: Secondary | ICD-10-CM | POA: Diagnosis present

## 2017-01-25 DIAGNOSIS — F17210 Nicotine dependence, cigarettes, uncomplicated: Secondary | ICD-10-CM | POA: Diagnosis present

## 2017-01-25 DIAGNOSIS — I10 Essential (primary) hypertension: Secondary | ICD-10-CM | POA: Diagnosis not present

## 2017-01-25 DIAGNOSIS — I483 Typical atrial flutter: Secondary | ICD-10-CM | POA: Diagnosis present

## 2017-01-25 DIAGNOSIS — Z8249 Family history of ischemic heart disease and other diseases of the circulatory system: Secondary | ICD-10-CM | POA: Diagnosis not present

## 2017-01-25 DIAGNOSIS — I428 Other cardiomyopathies: Secondary | ICD-10-CM | POA: Diagnosis not present

## 2017-01-25 DIAGNOSIS — R109 Unspecified abdominal pain: Secondary | ICD-10-CM | POA: Diagnosis not present

## 2017-01-25 DIAGNOSIS — N179 Acute kidney failure, unspecified: Secondary | ICD-10-CM | POA: Diagnosis present

## 2017-01-25 DIAGNOSIS — F1010 Alcohol abuse, uncomplicated: Secondary | ICD-10-CM | POA: Diagnosis present

## 2017-01-25 DIAGNOSIS — I110 Hypertensive heart disease with heart failure: Secondary | ICD-10-CM | POA: Diagnosis present

## 2017-01-25 DIAGNOSIS — R946 Abnormal results of thyroid function studies: Secondary | ICD-10-CM | POA: Diagnosis present

## 2017-01-25 DIAGNOSIS — I5021 Acute systolic (congestive) heart failure: Secondary | ICD-10-CM | POA: Diagnosis not present

## 2017-01-25 DIAGNOSIS — R1013 Epigastric pain: Secondary | ICD-10-CM | POA: Diagnosis present

## 2017-01-25 DIAGNOSIS — R0602 Shortness of breath: Secondary | ICD-10-CM | POA: Diagnosis not present

## 2017-01-25 DIAGNOSIS — Z5321 Procedure and treatment not carried out due to patient leaving prior to being seen by health care provider: Secondary | ICD-10-CM | POA: Diagnosis not present

## 2017-01-25 DIAGNOSIS — I340 Nonrheumatic mitral (valve) insufficiency: Secondary | ICD-10-CM | POA: Diagnosis not present

## 2017-01-25 DIAGNOSIS — R140 Abdominal distension (gaseous): Secondary | ICD-10-CM | POA: Diagnosis not present

## 2017-01-25 DIAGNOSIS — R000 Tachycardia, unspecified: Secondary | ICD-10-CM | POA: Diagnosis not present

## 2017-01-25 DIAGNOSIS — E785 Hyperlipidemia, unspecified: Secondary | ICD-10-CM | POA: Diagnosis present

## 2017-01-25 DIAGNOSIS — I517 Cardiomegaly: Secondary | ICD-10-CM | POA: Diagnosis not present

## 2017-01-25 DIAGNOSIS — R110 Nausea: Secondary | ICD-10-CM | POA: Diagnosis not present

## 2017-01-25 DIAGNOSIS — R05 Cough: Secondary | ICD-10-CM | POA: Diagnosis not present

## 2017-01-25 DIAGNOSIS — I5043 Acute on chronic combined systolic (congestive) and diastolic (congestive) heart failure: Secondary | ICD-10-CM | POA: Diagnosis not present

## 2017-01-25 DIAGNOSIS — I4891 Unspecified atrial fibrillation: Secondary | ICD-10-CM | POA: Diagnosis not present

## 2017-01-25 HISTORY — DX: Hyperlipidemia, unspecified: E78.5

## 2017-01-25 LAB — CBC WITH DIFFERENTIAL/PLATELET
Basophils Absolute: 0 10*3/uL (ref 0.0–0.1)
Basophils Relative: 1 %
Eosinophils Absolute: 0.1 10*3/uL (ref 0.0–0.7)
Eosinophils Relative: 2 %
HCT: 46.8 % (ref 39.0–52.0)
Hemoglobin: 16 g/dL (ref 13.0–17.0)
Lymphocytes Relative: 20 %
Lymphs Abs: 1.2 10*3/uL (ref 0.7–4.0)
MCH: 29.3 pg (ref 26.0–34.0)
MCHC: 34.2 g/dL (ref 30.0–36.0)
MCV: 85.6 fL (ref 78.0–100.0)
Monocytes Absolute: 0.4 10*3/uL (ref 0.1–1.0)
Monocytes Relative: 7 %
Neutro Abs: 4.4 10*3/uL (ref 1.7–7.7)
Neutrophils Relative %: 70 %
Platelets: 243 10*3/uL (ref 150–400)
RBC: 5.47 MIL/uL (ref 4.22–5.81)
RDW: 15.5 % (ref 11.5–15.5)
WBC: 6.2 10*3/uL (ref 4.0–10.5)

## 2017-01-25 LAB — TROPONIN I: Troponin I: 0.09 ng/mL (ref <0.03)

## 2017-01-25 LAB — COMPREHENSIVE METABOLIC PANEL
ALBUMIN: 3.8 g/dL (ref 3.5–5.0)
ALT: 78 U/L — AB (ref 17–63)
AST: 54 U/L — AB (ref 15–41)
Alkaline Phosphatase: 132 U/L — ABNORMAL HIGH (ref 38–126)
Anion gap: 8 (ref 5–15)
BILIRUBIN TOTAL: 0.8 mg/dL (ref 0.3–1.2)
BUN: 11 mg/dL (ref 6–20)
CHLORIDE: 107 mmol/L (ref 101–111)
CO2: 23 mmol/L (ref 22–32)
CREATININE: 1.14 mg/dL (ref 0.61–1.24)
Calcium: 8.9 mg/dL (ref 8.9–10.3)
GFR calc Af Amer: >60 mL/min (ref >60)
GFR calc non Af Amer: >60 mL/min (ref >60)
GLUCOSE: 118 mg/dL — AB (ref 65–99)
POTASSIUM: 4.4 mmol/L (ref 3.5–5.1)
Sodium: 138 mmol/L (ref 135–145)
Total Protein: 7 g/dL (ref 6.5–8.1)

## 2017-01-25 LAB — LIPASE, BLOOD: Lipase: 33 U/L (ref 11–51)

## 2017-01-25 LAB — PHOSPHORUS: PHOSPHORUS: 3.5 mg/dL (ref 2.5–4.6)

## 2017-01-25 LAB — MAGNESIUM: Magnesium: 2.2 mg/dL (ref 1.7–2.4)

## 2017-01-25 MED ORDER — SODIUM CHLORIDE 0.9 % IV BOLUS (SEPSIS)
1000.0000 mL | Freq: Once | INTRAVENOUS | Status: AC
  Administered 2017-01-25: 1000 mL via INTRAVENOUS

## 2017-01-25 MED ORDER — LORAZEPAM 1 MG PO TABS
1.0000 mg | ORAL_TABLET | Freq: Once | ORAL | Status: AC
  Administered 2017-01-25: 1 mg via ORAL
  Filled 2017-01-25: qty 1

## 2017-01-25 MED ORDER — DILTIAZEM LOAD VIA INFUSION
20.0000 mg | Freq: Once | INTRAVENOUS | Status: AC
  Administered 2017-01-25: 20 mg via INTRAVENOUS
  Filled 2017-01-25: qty 20

## 2017-01-25 MED ORDER — HEPARIN BOLUS VIA INFUSION
5000.0000 [IU] | Freq: Once | INTRAVENOUS | Status: AC
  Administered 2017-01-25: 5000 [IU] via INTRAVENOUS

## 2017-01-25 MED ORDER — LORAZEPAM 2 MG/ML IJ SOLN: 1.0000 mg | Freq: Once | INTRAMUSCULAR | Status: DC

## 2017-01-25 MED ORDER — HEPARIN (PORCINE) IN NACL 100-0.45 UNIT/ML-% IJ SOLN
1150.0000 [IU]/h | INTRAMUSCULAR | Status: DC
  Administered 2017-01-25: 1250 [IU]/h via INTRAVENOUS
  Administered 2017-01-26: 1150 [IU]/h via INTRAVENOUS
  Filled 2017-01-25 (×2): qty 250

## 2017-01-25 MED ORDER — DIPHENHYDRAMINE HCL 25 MG PO CAPS: 50.0000 mg | ORAL_CAPSULE | Freq: Once | ORAL | Status: DC

## 2017-01-25 MED ORDER — ONDANSETRON HCL 4 MG/2ML IJ SOLN
4.0000 mg | Freq: Once | INTRAMUSCULAR | Status: AC
  Administered 2017-01-25: 4 mg via INTRAVENOUS
  Filled 2017-01-25: qty 2

## 2017-01-25 MED ORDER — DILTIAZEM HCL 100 MG IV SOLR
5.0000 mg/h | INTRAVENOUS | Status: DC
  Administered 2017-01-26: 5 mg/h via INTRAVENOUS
  Filled 2017-01-25 (×3): qty 100

## 2017-01-25 MED ORDER — DILTIAZEM HCL 25 MG/5ML IV SOLN: 20.0000 mg | Freq: Once | INTRAVENOUS | Status: DC

## 2017-01-25 MED ORDER — PANTOPRAZOLE SODIUM 40 MG IV SOLR
40.0000 mg | Freq: Once | INTRAVENOUS | Status: DC
  Filled 2017-01-25: qty 40

## 2017-01-25 MED ORDER — PNEUMOCOCCAL VAC POLYVALENT 25 MCG/0.5ML IJ INJ
0.5000 mL | INJECTION | INTRAMUSCULAR | Status: DC
  Filled 2017-01-25: qty 0.5

## 2017-01-25 MED ORDER — ASPIRIN 81 MG PO CHEW
324.0000 mg | CHEWABLE_TABLET | Freq: Once | ORAL | Status: AC
  Administered 2017-01-25: 324 mg via ORAL
  Filled 2017-01-25: qty 4

## 2017-01-25 MED ORDER — HYDROMORPHONE HCL 1 MG/ML IJ SOLN
1.0000 mg | Freq: Once | INTRAMUSCULAR | Status: AC
  Administered 2017-01-25: 1 mg via INTRAVENOUS
  Filled 2017-01-25: qty 1

## 2017-01-25 NOTE — ED Notes (Signed)
Family updated on admission process

## 2017-01-25 NOTE — ED Provider Notes (Signed)
[facility] EMERGENCY DEPARTMENT Provider Note   CSN: [id number] Arrival date & time: 01/25/17  1622     History   Chief Complaint Chief Complaint  Patient presents with  . Abdominal Pain    HPI Devin [name]. is a 41 y.o. male.  Patient without any surgical history presents to the emergency department tonight with upper abdominal pain starting about 10 AM today.  Pain is sharp in the epigastrium.  Patient has had nausea but no vomiting.  Pain does not radiate.  No diarrhea or fevers.  Patient denies any chest pain, diaphoresis.  Patient drinks 2-3 beers every day but admits to drinking more heavily yesterday.  No history of similar symptoms in the past.  Patient has history of hypertension.  No history of high cholesterol, diabetes.  Patient smokes cigarettes.  No strong family history of MI at a young age in first-degree relatives.  When asked about elevated heart rate on arrival, patient notes that he has had several intermittent episodes of racing heart over the past several days to weeks.  No treatments prior to arrival. The onset of this condition was acute. The course is constant. Aggravating factors: none. Alleviating factors: none.        Past Medical History:  Diagnosis Date  . Hypertension     Patient Active Problem List   Diagnosis Date Noted  . Essential hypertension 12/15/2014    Past Surgical History:  Procedure Laterality Date  . NO PAST SURGERIES         Home Medications    Prior to Admission medications   Medication Sig Start Date End Date Taking? Authorizing Provider  lisinopril (PRINIVIL,ZESTRIL) 5 MG tablet Take 1 tablet (5 mg total) by mouth daily. 11/21/16  Yes Shelda Pal, DO  aspirin-acetaminophen-caffeine (EXCEDRIN MIGRAINE) [phone] MG per tablet Take 2 tablets by mouth every 6 (six) hours as needed for headache or migraine.    [provider]    Family History Family History  Problem Relation Age of  Onset  . Diabetes Mother   . Hypertension Mother   . Hypertension Father   . Hypertension Sister   . Asthma Son   . Cancer Maternal Grandfather   . Cancer Paternal Grandmother        cancer  . Heart attack Paternal Grandfather     Social History Social History   Tobacco Use  . Smoking status: Current Every Day Smoker    Packs/day: 0.40    Years: 22.00    Pack years: 8.80    Types: Cigarettes  . Smokeless tobacco: Never Used  Substance Use Topics  . Alcohol use: Yes  . Drug use: No     Allergies   Patient has no known allergies.   Review of Systems Review of Systems  Constitutional: Negative for diaphoresis and fever.  Eyes: Negative for redness.  Respiratory: Positive for shortness of breath. Negative for cough.   Cardiovascular: Positive for palpitations. Negative for chest pain and leg swelling.  Gastrointestinal: Positive for abdominal pain and nausea. Negative for vomiting.  Genitourinary: Negative for dysuria.  Musculoskeletal: Negative for back pain and neck pain.  Skin: Negative for rash.  Neurological: Negative for syncope and light-headedness.  Psychiatric/Behavioral: The patient is not nervous/anxious.      Physical Exam Updated Vital Signs BP 113/78 (BP Location: Left Arm)   Pulse (!) 140   Temp 99 F (37.2 C) (Oral)   Resp 20   Ht 6' (1.829 m)  Wt 84.8 kg (187 lb)   SpO2 98%   BMI 25.36 kg/m   Physical Exam  Constitutional: He appears well-developed and well-nourished.  HENT:  Head: Normocephalic and atraumatic.  Eyes: Conjunctivae are normal. Right eye exhibits no discharge. Left eye exhibits no discharge.  Neck: Normal range of motion. Neck supple.  Cardiovascular: Normal heart sounds. An irregularly irregular rhythm present. Tachycardia present.  EKG shows variable rapid atrial flutter.   Pulmonary/Chest: Effort normal and breath sounds normal.  Abdominal: Soft. Bowel sounds are normal. There is tenderness (minimal) in the  epigastric area.  Neurological: He is alert.  Skin: Skin is warm and dry.  Psychiatric: He has a normal mood and affect.  Nursing note and vitals reviewed.    ED Treatments / Results  Labs (all labs ordered are listed, but only abnormal results are displayed) Labs Reviewed  COMPREHENSIVE METABOLIC PANEL - Abnormal; Notable for the following components:      Result Value   Glucose, Bld 118 (*)    AST 54 (*)    ALT 78 (*)    Alkaline Phosphatase 132 (*)    All other components within normal limits  TROPONIN I - Abnormal; Notable for the following components:   Troponin I 0.09 (*)    All other components within normal limits  CBC WITH DIFFERENTIAL/PLATELET  LIPASE, BLOOD  MAGNESIUM  PHOSPHORUS  HEPARIN LEVEL (UNFRACTIONATED)  RAPID URINE DRUG SCREEN, HOSP PERFORMED    EKG  EKG Interpretation  Date/Time:  Saturday January 25 2017 18:03:48 EST Ventricular Rate:  110 PR Interval:    QRS Duration: 143 QT Interval:  327 QTC Calculation: 443 R Axis:   37 Text Interpretation:  Atrial flutter Nonspecific intraventricular conduction delay Lateral leads are also involved Confirmed by Quintella Reichert [phone]) on 01/25/2017 6:12:26 PM       Radiology No results found.  Procedures Procedures (including critical care time)  Medications Ordered in ED Medications  diltiazem (CARDIZEM) 1 mg/mL load via infusion 20 mg (20 mg Intravenous Bolus from Bag 01/25/17 1900)    And  diltiazem (CARDIZEM) 100 mg in dextrose 5 % 100 mL (1 mg/mL) infusion (5 mg/hr Intravenous Rate/Dose Change 01/25/17 1907)  diphenhydrAMINE (BENADRYL) capsule 50 mg (50 mg Oral Not Given 01/25/17 1919)  heparin ADULT infusion 100 units/mL (25000 units/244mL sodium chloride 0.45%) (1,250 Units/hr Intravenous New Bag/Given 01/25/17 1916)  pantoprazole (PROTONIX) injection 40 mg (not administered)  sodium chloride 0.9 % bolus 1,000 mL (0 mLs Intravenous Stopped 01/25/17 1801)  HYDROmorphone (DILAUDID) injection 1 mg  (1 mg Intravenous Given 01/25/17 1707)  ondansetron (ZOFRAN) injection 4 mg (4 mg Intravenous Given 01/25/17 1706)  aspirin chewable tablet 324 mg (324 mg Oral Given 01/25/17 1840)  heparin bolus via infusion 5,000 Units (5,000 Units Intravenous Bolus from Bag 01/25/17 1916)     Initial Impression / Assessment and Plan / ED Course  I have reviewed the triage vital signs and the nursing notes.  Pertinent labs & imaging results that were available during my care of the patient were reviewed by me and considered in my medical decision making (see chart for details).     Patient seen and examined. Reviewed EKG with Dr. Ralene Bathe. Pt does not have CP. C/o mid-upper abd pain. No resp distress. A flutter noted. No STEMI. Will treat underlying sx and monitor.    Vital signs reviewed and are as follows: BP 113/78 (BP Location: Left Arm)   Pulse (!) 140   Temp 99 F (37.2 C) (  Oral)   Resp 20   Ht 6' (1.829 m)   Wt 84.8 kg (187 lb)   SpO2 98%   BMI 25.36 kg/m   Workup shows mildly elevated troponin.  Dr. Ralene Bathe discussed patient with cardiology and then admitted patient.  Updated patient on results.  He is feeling better with treatment of pain.  Heart rate improved to the low 100s.  He states he is feeling better.  Understands he will need admit.  BP 119/72   Pulse 72   Temp 99 F (37.2 C) (Oral)   Resp (!) 30   Ht 6' (1.829 m)   Wt 84.8 kg (187 lb)   SpO2 100%   BMI 25.36 kg/m    Final Clinical Impressions(s) / ED Diagnoses   Final diagnoses:  Atrial flutter with rapid ventricular response (HCC)  Epigastric pain   Admit.   ED Discharge Orders    None       Carlisle Cater, Hershal Coria 01/25/17 2045    Quintella Reichert, MD 01/26/17 [phone]

## 2017-01-25 NOTE — ED Notes (Signed)
ED Provider at bedside.  Merrily Pew, EDPa

## 2017-01-25 NOTE — Progress Notes (Signed)
[facility] for heparin Indication: atrial fibrillation  No Known Allergies  Patient Measurements: Height: 6' (182.9 cm) Weight: 187 lb (84.8 kg) IBW/kg (Calculated) : 77.6 Heparin Dosing Weight: 85 kg  Vital Signs: Temp: 99 F (37.2 C) (12/08 1632) Temp Source: Oral (12/08 1632) BP: 137/92 (12/08 1800) Pulse Rate: 65 (12/08 1800)  Labs: Recent Labs    01/25/17 1650  HGB 16.0  HCT 46.8  PLT 243  CREATININE 1.14  TROPONINI 0.09*    Estimated Creatinine Clearance: 95.5 mL/min (by C-G formula based on SCr of 1.14 mg/dL).  Assessment: CC/HPI: 41 yo m originally presented with LUQ abd pain, nausea - also in aflutter  PMH: none  Anticoag: none pta - iv hep for new onset a flutter  Renal: SCr 1.14  Heme/Onc: H&H 16/46.8, Plt 243  Goal of Therapy:  Heparin level 0.3-0.7 units/ml Monitor platelets by anticoagulation protocol: Yes   Plan:  Levester Fresh, PharmD, BCPS, BCCCP Clinical Pharmacist Clinical phone for 01/25/2017 from 7a-3:30p: [phone] If after 3:30p, please call main pharmacy at: [phone] 01/25/2017 6:37 PM

## 2017-01-25 NOTE — Plan of Care (Signed)
[facility] note  PMHx of ETOH abuse & HTN.  Patient came in complaining of acute abdominal pain.  Noted to be very tachycardic.  Per EDP abdominal exam not very impressive except for some mild epigastric tenderness.  Lipase is negative.  EKG showed variable block flutter.  EDP spoke to cardiology who wants hospitalist admit due to CC being abd pain which still has unclear cause. Per EDP cardiology reviewed EKG. Patient getting started on diltiazem drip.  Orders: requested 2vw abd, ordered protonix due to likelihood of ETOH gastritis plus placed on heparin  Status: Inpatient, stepdown unit  Elwin Mocha MD

## 2017-01-25 NOTE — ED Notes (Signed)
Confirmed: Lab running mag & phos

## 2017-01-25 NOTE — ED Notes (Signed)
Pt transported to xray on cardiac monitor with RN

## 2017-01-25 NOTE — ED Notes (Signed)
Alert, NAD, calm, interactive, resps e/u, speaking in clear complete sentences, no dyspnea noted, skin W&D, VSS, mentions some mild sob, (denies: pain, nausea, dizziness or visual changes). Aflutter on monitor. Intermittently regular and irregular. HR 89-107. Family at [facility]. Pt to xray.

## 2017-01-25 NOTE — ED Notes (Signed)
No changes. VSS, pt calmer, NAD, alert, interactive, resps e/u. [facility] here for transport.

## 2017-01-25 NOTE — ED Triage Notes (Signed)
LUQ abd pain since 10 this morning with nausea.

## 2017-01-25 NOTE — ED Provider Notes (Signed)
Patient with history of hypertension here for evaluation of epigastric pain, shortness of breath.  Initial concern for possible gastritis versus pancreatitis.  Presenting EKG with atrial flutter.  He was treated with IV fluids, pain medications.  On repeat assessment after medications patient's pain is improved but atrial flutter is persistent.  He was treated with diltiazem for rate control.  Presentation is not consistent with acute abdomen.  D/w Dr. Harrell Gave with  Cardiology - recommends anticoagulate, rate control, medicine admission.    Hospitalist consulted for admission for new onset atrial flutter.  CRITICAL CARE Performed by: Quintella Reichert   Total critical care time: 35 minutes  Critical care time was exclusive of separately billable procedures and treating other patients.  Critical care was necessary to treat or prevent imminent or life-threatening deterioration.  Critical care was time spent personally by me on the following activities: development of treatment plan with patient and/or surrogate as well as nursing, discussions with consultants, evaluation of patient's response to treatment, examination of patient, obtaining history from patient or surrogate, ordering and performing treatments and interventions, ordering and review of laboratory studies, ordering and review of radiographic studies, pulse oximetry and re-evaluation of patient's condition.    Quintella Reichert, MD 01/26/17 [phone]

## 2017-01-25 NOTE — ED Notes (Signed)
Pt reports upper abd pain since 10am. Also states he feels SOB, "like I'm not getting in enough air". Admits to drinking a few beers a day but states "I drank more than usual last night". Took pepto and tums without relief pta

## 2017-01-25 NOTE — ED Notes (Addendum)
Back from xray, no changes, alert, NAD, calm, "feel better", denies pain, heartburn, indigestion, reflux, returned to monitor, HR 74 aflutter.

## 2017-01-25 NOTE — ED Notes (Signed)
Repeated EKG given to PA and MD

## 2017-01-25 NOTE — ED Notes (Signed)
Troponin 0.09, results given to ED provider

## 2017-01-26 ENCOUNTER — Inpatient Hospital Stay (HOSPITAL_COMMUNITY): Payer: Commercial Managed Care - PPO

## 2017-01-26 ENCOUNTER — Encounter (HOSPITAL_COMMUNITY): Payer: Self-pay | Admitting: Internal Medicine

## 2017-01-26 DIAGNOSIS — I4892 Unspecified atrial flutter: Principal | ICD-10-CM

## 2017-01-26 DIAGNOSIS — I340 Nonrheumatic mitral (valve) insufficiency: Secondary | ICD-10-CM

## 2017-01-26 DIAGNOSIS — F1010 Alcohol abuse, uncomplicated: Secondary | ICD-10-CM | POA: Diagnosis present

## 2017-01-26 DIAGNOSIS — R1013 Epigastric pain: Secondary | ICD-10-CM | POA: Diagnosis present

## 2017-01-26 LAB — BLOOD GAS, ARTERIAL
ACID-BASE DEFICIT: 0.2 mmol/L (ref 0.0–2.0)
Bicarbonate: 23.8 mmol/L (ref 20.0–28.0)
DRAWN BY: 52076
O2 CONTENT: 4 L/min
O2 SAT: 85.9 %
PATIENT TEMPERATURE: 98.6
PCO2 ART: 38.3 mmHg (ref 32.0–48.0)
pH, Arterial: 7.411 (ref 7.350–7.450)
pO2, Arterial: 52.4 mmHg — ABNORMAL LOW (ref 83.0–108.0)

## 2017-01-26 LAB — TSH: TSH: 0.09 u[IU]/mL — ABNORMAL LOW (ref 0.350–4.500)

## 2017-01-26 LAB — BASIC METABOLIC PANEL
ANION GAP: 7 (ref 5–15)
BUN: 6 mg/dL (ref 6–20)
CALCIUM: 9.2 mg/dL (ref 8.9–10.3)
CO2: 21 mmol/L — AB (ref 22–32)
CREATININE: 1.08 mg/dL (ref 0.61–1.24)
Chloride: 108 mmol/L (ref 101–111)
GFR calc Af Amer: >60 mL/min (ref >60)
GLUCOSE: 123 mg/dL — AB (ref 65–99)
Potassium: 4.6 mmol/L (ref 3.5–5.1)
Sodium: 136 mmol/L (ref 135–145)

## 2017-01-26 LAB — LIPASE, BLOOD: LIPASE: 32 U/L (ref 11–51)

## 2017-01-26 LAB — ECHOCARDIOGRAM COMPLETE
Height: 72 in
WEIGHTICAEL: 2878.33 [oz_av]

## 2017-01-26 LAB — HEPARIN LEVEL (UNFRACTIONATED)
Heparin Unfractionated: 0.85 IU/mL — ABNORMAL HIGH (ref 0.30–0.70)
Heparin Unfractionated: 0.77 IU/mL — ABNORMAL HIGH (ref 0.30–0.70)
Heparin Unfractionated: 0.64 IU/mL (ref 0.30–0.70)

## 2017-01-26 LAB — CBC
HCT: 43.8 % (ref 39.0–52.0)
HEMOGLOBIN: 15 g/dL (ref 13.0–17.0)
MCH: 29.9 pg (ref 26.0–34.0)
MCHC: 34.2 g/dL (ref 30.0–36.0)
MCV: 87.4 fL (ref 78.0–100.0)
Platelets: 226 10*3/uL (ref 150–400)
RBC: 5.01 MIL/uL (ref 4.22–5.81)
RDW: 15 % (ref 11.5–15.5)
WBC: 8.8 10*3/uL (ref 4.0–10.5)

## 2017-01-26 LAB — D-DIMER, QUANTITATIVE (NOT AT ARMC): D DIMER QUANT: 0.33 ug{FEU}/mL (ref 0.00–0.50)

## 2017-01-26 LAB — HEPATIC FUNCTION PANEL
ALK PHOS: 113 U/L (ref 38–126)
ALT: 61 U/L (ref 17–63)
AST: 38 U/L (ref 15–41)
Albumin: 3.3 g/dL — ABNORMAL LOW (ref 3.5–5.0)
BILIRUBIN INDIRECT: 0.8 mg/dL (ref 0.3–0.9)
Bilirubin, Direct: 0.2 mg/dL (ref 0.1–0.5)
TOTAL PROTEIN: 5.9 g/dL — AB (ref 6.5–8.1)
Total Bilirubin: 1 mg/dL (ref 0.3–1.2)

## 2017-01-26 LAB — BRAIN NATRIURETIC PEPTIDE
B NATRIURETIC PEPTIDE 5: 345.4 pg/mL — AB (ref 0.0–100.0)
B NATRIURETIC PEPTIDE 5: 174 pg/mL — AB (ref 0.0–100.0)

## 2017-01-26 LAB — MRSA PCR SCREENING: MRSA BY PCR: NEGATIVE

## 2017-01-26 LAB — RAPID URINE DRUG SCREEN, HOSP PERFORMED
Amphetamines: NOT DETECTED
BARBITURATES: NOT DETECTED
BENZODIAZEPINES: NOT DETECTED
COCAINE: NOT DETECTED
Opiates: POSITIVE — AB
Tetrahydrocannabinol: NOT DETECTED

## 2017-01-26 LAB — TROPONIN I
Troponin I: 0.06 ng/mL (ref <0.03)
Troponin I: 0.07 ng/mL (ref <0.03)

## 2017-01-26 LAB — LACTIC ACID, PLASMA: LACTIC ACID, VENOUS: 2 mmol/L — AB (ref 0.5–1.9)

## 2017-01-26 LAB — T4, FREE: Free T4: 0.97 ng/dL (ref 0.61–1.12)

## 2017-01-26 MED ORDER — ACETAMINOPHEN 650 MG RE SUPP: 650.0000 mg | Freq: Four times a day (QID) | RECTAL | Status: DC | PRN

## 2017-01-26 MED ORDER — ASPIRIN EC 81 MG PO TBEC
81.0000 mg | DELAYED_RELEASE_TABLET | Freq: Every day | ORAL | Status: DC
  Administered 2017-01-26 – 2017-01-28 (×2): 81 mg via ORAL
  Filled 2017-01-26 (×2): qty 1

## 2017-01-26 MED ORDER — FOLIC ACID 5 MG/ML IJ SOLN
1.0000 mg | Freq: Every day | INTRAMUSCULAR | Status: DC
  Filled 2017-01-26: qty 0.2

## 2017-01-26 MED ORDER — LORAZEPAM 2 MG/ML IJ SOLN
2.0000 mg | INTRAMUSCULAR | Status: DC | PRN
  Administered 2017-01-26 – 2017-01-27 (×5): 2 mg via INTRAVENOUS
  Filled 2017-01-26 (×5): qty 1

## 2017-01-26 MED ORDER — GI COCKTAIL ~~LOC~~: 30.0000 mL | Freq: Three times a day (TID) | ORAL | Status: DC | PRN

## 2017-01-26 MED ORDER — IOPAMIDOL (ISOVUE-300) INJECTION 61%
INTRAVENOUS | Status: AC
  Administered 2017-01-26: 100 mL
  Filled 2017-01-26: qty 100

## 2017-01-26 MED ORDER — HEPARIN (PORCINE) IN NACL 100-0.45 UNIT/ML-% IJ SOLN
1000.0000 [IU]/h | INTRAMUSCULAR | Status: DC
  Administered 2017-01-26: 1000 [IU]/h via INTRAVENOUS

## 2017-01-26 MED ORDER — ACETAMINOPHEN 500 MG PO TABS
500.0000 mg | ORAL_TABLET | Freq: Four times a day (QID) | ORAL | Status: DC | PRN
  Administered 2017-01-27: 500 mg via ORAL
  Filled 2017-01-26: qty 1

## 2017-01-26 MED ORDER — VITAMIN B-1 100 MG PO TABS
100.0000 mg | ORAL_TABLET | Freq: Every day | ORAL | Status: DC
  Administered 2017-01-26 – 2017-01-28 (×2): 100 mg via ORAL
  Filled 2017-01-26 (×2): qty 1

## 2017-01-26 MED ORDER — PANTOPRAZOLE SODIUM 40 MG PO TBEC: 40.0000 mg | DELAYED_RELEASE_TABLET | Freq: Every day | ORAL | Status: DC

## 2017-01-26 MED ORDER — MORPHINE SULFATE (PF) 2 MG/ML IV SOLN: 1.0000 mg | INTRAVENOUS | Status: DC | PRN

## 2017-01-26 MED ORDER — ACETAMINOPHEN 325 MG PO TABS: 650.0000 mg | ORAL_TABLET | Freq: Four times a day (QID) | ORAL | Status: DC | PRN

## 2017-01-26 MED ORDER — FOLIC ACID 1 MG PO TABS
1.0000 mg | ORAL_TABLET | Freq: Every day | ORAL | Status: DC
  Administered 2017-01-26 – 2017-01-28 (×2): 1 mg via ORAL
  Filled 2017-01-26 (×2): qty 1

## 2017-01-26 MED ORDER — MENTHOL 3 MG MT LOZG
1.0000 | LOZENGE | OROMUCOSAL | Status: DC | PRN
  Administered 2017-01-27: 3 mg via ORAL
  Filled 2017-01-26 (×2): qty 9

## 2017-01-26 MED ORDER — PANTOPRAZOLE SODIUM 40 MG IV SOLR
40.0000 mg | INTRAVENOUS | Status: DC
  Administered 2017-01-26: 40 mg via INTRAVENOUS
  Filled 2017-01-26: qty 40

## 2017-01-26 MED ORDER — DILTIAZEM HCL 100 MG IV SOLR
5.0000 mg/h | INTRAVENOUS | Status: DC
  Administered 2017-01-26: 7.5 mg/h via INTRAVENOUS
  Filled 2017-01-26: qty 100

## 2017-01-26 MED ORDER — GUAIFENESIN 100 MG/5ML PO SOLN
5.0000 mL | ORAL | Status: DC | PRN
  Administered 2017-01-26: 100 mg via ORAL
  Filled 2017-01-26: qty 5

## 2017-01-26 MED ORDER — SODIUM CHLORIDE 0.9 % IV SOLN: INTRAVENOUS | Status: DC

## 2017-01-26 MED ORDER — ONDANSETRON HCL 4 MG PO TABS: 4.0000 mg | ORAL_TABLET | Freq: Four times a day (QID) | ORAL | Status: DC | PRN

## 2017-01-26 MED ORDER — ONDANSETRON HCL 4 MG/2ML IJ SOLN: 4.0000 mg | Freq: Four times a day (QID) | INTRAMUSCULAR | Status: DC | PRN

## 2017-01-26 MED ORDER — IOPAMIDOL (ISOVUE-300) INJECTION 61%
INTRAVENOUS | Status: AC
  Filled 2017-01-26: qty 30

## 2017-01-26 MED ORDER — THIAMINE HCL 100 MG/ML IJ SOLN: 100.0000 mg | Freq: Every day | INTRAMUSCULAR | Status: DC

## 2017-01-26 NOTE — Progress Notes (Signed)
ANTICOAGULATION CONSULT NOTE - Follow Up Consult  Pharmacy Consult for Heparin  Indication: Atrial flutter  No Known Allergies  Patient Measurements: Height: 6' (182.9 cm) Weight: 179 lb 14.3 oz (81.6 kg) IBW/kg (Calculated) : 77.6  Vital Signs: Temp: 98.5 F (36.9 C) (12/08 2252) Temp Source: Oral (12/08 2252) BP: 100/71 (12/09 0231) Pulse Rate: 64 (12/09 0231)  Labs: Recent Labs    01/25/17 1650 01/26/17 0212  HGB 16.0 15.0  HCT 46.8 43.8  PLT 243 226  HEPARINUNFRC  --  0.85*  CREATININE 1.14  --   TROPONINI 0.09*  --     Estimated Creatinine Clearance: 95.5 mL/min (by C-G formula based on SCr of 1.14 mg/dL).  Assessment: 41 y/o M transfer from [facility] with abdominal pain and atrial flutter. On heparin drip at 1250 units/hr. Heparin level is elevated this AM.   Goal of Therapy:  Heparin level 0.3-0.7 units/ml Monitor platelets by anticoagulation protocol: Yes   Plan:  Dec heparin to 1150 units/hr 1100 HL  Traniece Boffa 01/26/2017,3:15 AM

## 2017-01-26 NOTE — Progress Notes (Signed)
[facility] TEAM 1 - Stepdown/ICU TEAM  [name]  YOV:[id number] DOB: 1975/02/25 DOA: 01/25/2017 PCP: Shelda Pal, DO    Brief Narrative:  41 y.o. male with hx of hypertension and alcohol abuse who presented to the ER with epigastric pain.    In the ER the patient was found to be tachycardic and EKG showed atrial flutter with RVR.  Cardiology was consulted and the patient was started on heparin and Cardizem infusion.   Subjective: Pt is seen for a f/u visit.    Assessment & Plan:  Atrial flutter with RVR CHA2DS2 VASc score is 1 - on heparin gtt and cardizem gtt at present - TSH is low (see below) - Cards following    Epigastric pain with a history of alcoholism Lipase normal - CT not striking - check RUQ [place] given findings on CT   Low TSH TSH low this admit - also low 12/06/16 as outpt, but free T4 at that time was normal at 1.2 - check free T4 and T3 now to r/o isolated T3-toxicosis    Alcohol abuse - 4-5 beers/day patient advised to quit drinking - cont stepdown CIWA protocol.  Tobacco abuse - 1/2 PPD advised to quit smoking  Hypertension usually takes lisinopril - presently on Cardizem infusion   DVT prophylaxis: heparin gtt Code Status: FULL CODE Family Communication: no family present at time of exam  Disposition Plan:   Consultants:  [facility] Cardiology   Procedures: none  Antimicrobials:  none   Objective: Blood pressure (!) 123/97, pulse (!) 115, temperature 98.7 F (37.1 C), temperature source Oral, resp. rate (!) 26, height 6' (1.829 m), weight 81.6 kg (179 lb 14.3 oz), SpO2 96 %.  Intake/Output Summary (Last 24 hours) at 01/26/2017 0931 Last data filed at 01/26/2017 0330 Gross per 24 hour  Intake 1193.95 ml  Output 300 ml  Net 893.95 ml   Filed Weights   01/25/17 1631 01/25/17 2259  Weight: 84.8 kg (187 lb) 81.6 kg (179 lb 14.3 oz)    Examination: Pt was seen for a f/u visit.    CBC: Recent Labs  Lab 01/25/17 1650  01/26/17 0212  WBC 6.2 8.8  NEUTROABS 4.4  --   HGB 16.0 15.0  HCT 46.8 43.8  MCV 85.6 87.4  PLT 243 741   Basic Metabolic Panel: Recent Labs  Lab 01/25/17 1650 01/26/17 0212  NA 138 136  K 4.4 4.6  CL 107 108  CO2 23 21*  GLUCOSE 118* 123*  BUN 11 6  CREATININE 1.14 1.08  CALCIUM 8.9 9.2  MG 2.2  --   PHOS 3.5  --    GFR: Estimated Creatinine Clearance: 100.8 mL/min (by C-G formula based on SCr of 1.08 mg/dL).  Liver Function Tests: Recent Labs  Lab 01/25/17 1650 01/26/17 0212  AST 54* 38  ALT 78* 61  ALKPHOS 132* 113  BILITOT 0.8 1.0  PROT 7.0 5.9*  ALBUMIN 3.8 3.3*   Recent Labs  Lab 01/25/17 1650 01/26/17 0212  LIPASE 33 32   No results for input(s): AMMONIA in the last 168 hours.  Coagulation Profile: No results for input(s): INR, PROTIME in the last 168 hours.  Cardiac Enzymes: Recent Labs  Lab 01/25/17 1650 01/26/17 0212  TROPONINI 0.09* 0.06*    HbA1C: Hgb A1c MFr Bld  Date/Time Value Ref Range Status  11/21/2016 09:09 AM 5.7 4.6 - 6.5 % Final    Comment:    Glycemic Control Guidelines for People with Diabetes:Non Diabetic:  <  6%Goal of Therapy: <7%Additional Action Suggested:  >8%     CBG: No results for input(s): GLUCAP in the last 168 hours.  Recent Results (from the past 240 hour(s))  MRSA PCR Screening     Status: None   Collection Time: 01/25/17 10:43 PM  Result Value Ref Range Status   MRSA by PCR NEGATIVE NEGATIVE Final    Comment:        The GeneXpert MRSA Assay (FDA approved for NASAL specimens only), is one component of a comprehensive MRSA colonization surveillance program. It is not intended to diagnose MRSA infection nor to guide or monitor treatment for MRSA infections.      Scheduled Meds: . aspirin EC  81 mg Oral Daily  . folic acid  1 mg Intravenous Daily  . iopamidol      . pantoprazole (PROTONIX) IV  40 mg Intravenous Once  . pantoprazole (PROTONIX) IV  40 mg Intravenous Q24H  . pneumococcal 23  valent vaccine  0.5 mL Intramuscular Tomorrow-1000  . thiamine  100 mg Intravenous Daily   Continuous Infusions: . sodium chloride    . diltiazem (CARDIZEM) infusion 5 mg/hr (01/26/17 0539)  . heparin 1,150 Units/hr (01/26/17 0314)     LOS: 1 day   Time spent: No Charge  Cherene Altes, MD [facility] Office  [phone] Pager - Text Page per Amion as per below:  On-Call/Text Page:      [email or web address]      password [email or web address]  If 7PM-7AM, please contact night-coverage [email or web address] Password [email or web address] 01/26/2017, 9:31 AM

## 2017-01-26 NOTE — Consult Note (Signed)
Cardiology Consultation:   Patient ID: Devin [name].; [id number]; Aug 14, 1975   Admit date: 01/25/2017 Date of Consult: 01/26/2017  Primary Care Provider: Shelda Pal, DO Primary Cardiologist: New   Patient Profile:   Devin Esquivias. is a 41 y.o. male with a hx of hypertension, HLD and alcohol abuse who is being seen today for the evaluation of atrial flutter at the request of Dr. Thereasa Solo.  History of Present Illness:   Mr. Hartsell presented to the ED in [facility] on 01/25/2017 with complaints of epigastric pain that began yesterday morning and was associated with nausea, no vomiting or diarrhea. He has never had this before. He was found to be in atrial flutter with RVR. He notes that he has had orthopnea and PND for about a week as well as intermittent doe. He works and has not been out of work any. He denies chest pain, palpitations, lightheadedness or edema.   He was evaluated about 2 years ago for palpitations and had a treadmill stress test per the patient and was placed on Coreg which he eventually discontinued.   He has no previous MI or significant cardiac issues.  He does smoke about 1/2 PPD and drinks 4-5 beers per day. He was treated in the past for hyperlipidemia with fenofibrate, but he stopped taking the medication on his own.    Significant findings: Trop 0.09, 0.06 BNP 345.4 K+ 4.4 Scr 1.14 TSH 0.090 Hgb 15.0,  WBCs 8.8 CXR: No edema or consolidation  UDS positive for opiates, negative for cocaine  Past Medical History:  Diagnosis Date  . Hypertension     Past Surgical History:  Procedure Laterality Date  . NO PAST SURGERIES       Home Medications:  Prior to Admission medications   Medication Sig Start Date End Date Taking? Authorizing Provider  lisinopril (PRINIVIL,ZESTRIL) 5 MG tablet Take 1 tablet (5 mg total) by mouth daily. 11/21/16  Yes Shelda Pal, DO  aspirin-acetaminophen-caffeine (EXCEDRIN MIGRAINE) [phone] MG per  tablet Take 2 tablets by mouth every 6 (six) hours as needed for headache or migraine.    [provider]    Inpatient Medications: Scheduled Meds: . aspirin EC  81 mg Oral Daily  . folic acid  1 mg Intravenous Daily  . iopamidol      . pantoprazole (PROTONIX) IV  40 mg Intravenous Once  . pantoprazole (PROTONIX) IV  40 mg Intravenous Q24H  . pneumococcal 23 valent vaccine  0.5 mL Intramuscular Tomorrow-1000  . thiamine  100 mg Intravenous Daily   Continuous Infusions: . sodium chloride    . diltiazem (CARDIZEM) infusion 5 mg/hr (01/26/17 0539)  . heparin 1,150 Units/hr (01/26/17 0314)   PRN Meds: acetaminophen **OR** acetaminophen, LORazepam, morphine injection, ondansetron **OR** ondansetron (ZOFRAN) IV  Allergies:   No Known Allergies  Social History:   Social History   Socioeconomic History  . Marital status: Married    Spouse name: Not on file  . Number of children: Not on file  . Years of education: Not on file  . Highest education level: Not on file  Social Needs  . Financial resource strain: Not on file  . Food insecurity - worry: Not on file  . Food insecurity - inability: Not on file  . Transportation needs - medical: Not on file  . Transportation needs - non-medical: Not on file  Occupational History  . Not on file  Tobacco Use  . Smoking status: Current Every Day Smoker  Packs/day: 0.40    Years: 22.00    Pack years: 8.80    Types: Cigarettes  . Smokeless tobacco: Never Used  Substance and Sexual Activity  . Alcohol use: Yes  . Drug use: No  . Sexual activity: Yes    Partners: Female  Other Topics Concern  . Not on file  Social History Narrative  . Not on file    Family History:    Family History  Problem Relation Age of Onset  . Diabetes Mother   . Hypertension Mother   . Hypertension Father   . Hypertension Sister   . Asthma Son   . Cancer Maternal Grandfather   . Cancer Paternal Grandmother        cancer  . Heart attack  Paternal Grandfather      ROS:  Please see the history of present illness.  ROS  All other ROS reviewed and negative.     Physical Exam/Data:   Vitals:   01/26/17 0430 01/26/17 0530 01/26/17 0600 01/26/17 0751  BP: 101/77 103/80 (!) 117/105 (!) 123/97  Pulse: 68 (!) 106 (!) 37 (!) 115  Resp: 17 (!) 28 (!) 26   Temp:      TempSrc:      SpO2: 100% 94% 96%   Weight:      Height:        Intake/Output Summary (Last 24 hours) at 01/26/2017 0810 Last data filed at 01/26/2017 0330 Gross per 24 hour  Intake 1193.95 ml  Output 300 ml  Net 893.95 ml   Filed Weights   01/25/17 1631 01/25/17 2259  Weight: 187 lb (84.8 kg) 179 lb 14.3 oz (81.6 kg)   Body mass index is 24.4 kg/m.  General:  Well nourished, well developed, in no acute distress HEENT: normal Lymph: no adenopathy Neck: + JVD Endocrine:  No thryomegaly Vascular: No carotid bruits; FA pulses 2+ bilaterally without bruits  Cardiac:  normal S1, S2; Irregularly irregular rhythm; no murmur  Lungs:  clear to auscultation bilaterally, no wheezing, rhonchi or rales. Diminished air movement in the bases Abd: soft, RUQ and epigastric tenderness Ext: no edema Musculoskeletal:  No deformities, BUE and BLE strength normal and equal Skin: warm and dry  Neuro:  CNs 2-12 intact, no focal abnormalities noted Psych:  Normal affect, sleepy due to Ativan dosing  EKG:  The EKG was personally reviewed and demonstrates:  Atrial flutter with non-specific intraventricular conduction delay Telemetry:  Telemetry was personally reviewed and demonstrates:  Atrial flutter in the 70's during the night, up to 100's-110's this am  Relevant CV Studies:  Echo in progress  Laboratory Data:  Chemistry Recent Labs  Lab 01/25/17 1650 01/26/17 0212  NA 138 136  K 4.4 4.6  CL 107 108  CO2 23 21*  GLUCOSE 118* 123*  BUN 11 6  CREATININE 1.14 1.08  CALCIUM 8.9 9.2  GFRNONAA >60 >60  GFRAA >60 >60  ANIONGAP 8 7    Recent Labs  Lab  01/25/17 1650 01/26/17 0212  PROT 7.0 5.9*  ALBUMIN 3.8 3.3*  AST 54* 38  ALT 78* 61  ALKPHOS 132* 113  BILITOT 0.8 1.0   Hematology Recent Labs  Lab 01/25/17 1650 01/26/17 0212  WBC 6.2 8.8  RBC 5.47 5.01  HGB 16.0 15.0  HCT 46.8 43.8  MCV 85.6 87.4  MCH 29.3 29.9  MCHC 34.2 34.2  RDW 15.5 15.0  PLT 243 226   Cardiac Enzymes Recent Labs  Lab 01/25/17 1650 01/26/17 3532  TROPONINI 0.09* 0.06*   No results for input(s): TROPIPOC in the last 168 hours.  BNP Recent Labs  Lab 01/26/17 0212  BNP 345.4*    DDimer No results for input(s): DDIMER in the last 168 hours.  Radiology/Studies:  Dg Chest 2 View  Result Date: 01/25/2017 CLINICAL DATA:  Shortness of breath and cough EXAM: CHEST  2 VIEW COMPARISON:  March 07, 2014 FINDINGS: There is no edema or consolidation. Heart size and pulmonary vascularity are normal. No adenopathy. No pneumothorax. There is thoracic lordosis. IMPRESSION: No edema or consolidation. Electronically Signed   By: Lowella Grip III M.D.   On: 01/25/2017 17:23   Ct Abdomen Pelvis W Contrast  Result Date: 01/26/2017 CLINICAL DATA:  Abdominal distension EXAM: CT ABDOMEN AND PELVIS WITH CONTRAST TECHNIQUE: Multidetector CT imaging of the abdomen and pelvis was performed using the standard protocol following bolus administration of intravenous contrast. CONTRAST:  128mL ISOVUE-300 IOPAMIDOL (ISOVUE-300) INJECTION 61% COMPARISON:  Abdominal radiographs, 01/25/2017 FINDINGS: Lower chest: Minimal, right greater than left, pleural effusions. Heart borderline enlarged. Hepatobiliary: Liver unremarkable. Gallbladder is mildly distended. Wall appears mildly thickened to 3-4 mm. No stones. No bile duct dilation. Pancreas: Unremarkable. No pancreatic ductal dilatation or surrounding inflammatory changes. Spleen: Normal in size without focal abnormality. Adrenals/Urinary Tract: Adrenal glands are unremarkable. Kidneys are normal, without renal calculi, focal  lesion, or hydronephrosis. Bladder is unremarkable. Stomach/Bowel: Stomach and small bowel unremarkable. No colonic dilation, wall thickening or inflammation. No significant increase in stool. Normal appendix visualized. Vascular/Lymphatic: No significant vascular findings are present. No enlarged abdominal or pelvic lymph nodes. Reproductive: Unremarkable. Other: Mild perinephric edema is seen bilaterally. Trace amount of fluid attenuation is seen adjacent to the gallbladder near Morison's pouch. No abdominal wall hernia. Musculoskeletal: No acute or significant osseous findings. IMPRESSION: 1. Gallbladder wall appears borderline thickened. This is nonspecific. If there are symptoms of acute cholecystitis, follow-up right upper quadrant limited ultrasound would be recommended. 2. Trace amount of peritoneal fluid seen adjacent to the gallbladder. Mild perinephric edema is seen bilaterally. Trace, right greater than left pleural effusions. 3. No other abnormalities. No significant bowel distention to account for abdominal distension. Electronically Signed   By: Lajean Manes M.D.   On: 01/26/2017 07:24   Dg Abd 2 Views  Result Date: 01/25/2017 CLINICAL DATA:  Periumbilical pain and nausea EXAM: ABDOMEN - 2 VIEW COMPARISON:  None. FINDINGS: Supine and upright images were obtained. There is moderate fluid and food material in stomach. There is moderate stool in colon. There is no appreciable bowel dilatation or air-fluid level to suggest bowel obstruction. No free air. There are tiny phleboliths in the pelvis. Lung bases are clear. IMPRESSION: Moderate stool in colon.  No bowel obstruction or free air evident. Electronically Signed   By: Lowella Grip III M.D.   On: 01/25/2017 20:07    Assessment and Plan:   1. Atrial flutter -Unknown duration, pt with symptoms for possibly at least 2 weeks-orthopnea, PND and occ DOE. No chest pain or palpitations.  -TSH 0.090 -currently on Cardizem drip with improved  rates, still in atrial flutter. Cardizem dose limited by blood pressure.  -CHA2DS2/VAS Stroke Risk Points is 1 (HTN). Currently on heparin for stroke risk reduction -Will work toward rate control  2. Heart failure -Pt with orthopnea and PND, possibly related to Aflutter with RVR, also could have alcohol related cardiomyopathy. -Undefined. Echo in progress  -No edmema on CXR -BNP Elevated at 345.4      3. Hypertension -Treated  with lisinopril 5 mg daily (has been on Coreg as well in the past) -Lisinopril on hold while treating aflutter with CCB -BP currently stable.   4. Hyperlipidemia -Has been treated with fenofibrate in the past -LDL 111 on 10/4/018  5. Abdominal pain -RUQ/epigastric tenderness -CT shows borderline thickened gallbladder walls. Trace amts of peritoneal fluid adjacent ot GB. Trace bilat pleural effusions R>L. management per IM.    6. Tobacco use -1/2 PPD. Advise cessation  7. Alcohol abuse -4-5 beers per day -On CIWA protocol per internal med.    For questions or updates, please contact [place] Please consult [email or web address] for contact info under Cardiology/STEMI.   Signed, Daune Perch, NP  01/26/2017 8:10 AM  EP Attending  Patient seen and examined. Agree with above history, exam, assessment and plan. The patient has epigastric pain and typical atrial flutter with a RVR,now controlled VR and is admitted for the above problems. He has a normal Lipase and his LFT's are just up a bit. Would recommend continuing his IV heparin and IV cardizem for rate control. If he does not convert to NSR spontaneously, we will cardiovert tomorrow. He needs to stop drinking. Check echo tomorrow.  Mikle Bosworth.D.

## 2017-01-26 NOTE — Progress Notes (Signed)
MD notified about elevated respirations and oxygen sats in the 80's on 4lnc continues to be flutter on Cardizem drip. Orders received to follow I will continue to monitor.

## 2017-01-26 NOTE — Progress Notes (Signed)
  Echocardiogram 2D Echocardiogram has been performed.  Devin [name] 01/26/2017, 9:05 AM

## 2017-01-26 NOTE — Progress Notes (Signed)
ANTICOAGULATION CONSULT NOTE - Follow Up Consult  Pharmacy Consult for Heparin  Indication: Atrial flutter  No Known Allergies  Patient Measurements: Height: 6' (182.9 cm) Weight: 179 lb 14.3 oz (81.6 kg) IBW/kg (Calculated) : 77.6  Vital Signs: Temp: 99 F (37.2 C) (12/09 1901) Temp Source: Oral (12/09 1901) BP: 120/104 (12/09 1901) Pulse Rate: 85 (12/09 1901)  Labs: Recent Labs    01/25/17 1650 01/26/17 0212 01/26/17 1047 01/26/17 2038  HGB 16.0 15.0  --   --   HCT 46.8 43.8  --   --   PLT 243 226  --   --   HEPARINUNFRC  --  0.85* 0.77* 0.64  CREATININE 1.14 1.08  --   --   TROPONINI 0.09* 0.06* 0.07*  --     Estimated Creatinine Clearance: 100.8 mL/min (by C-G formula based on SCr of 1.08 mg/dL).  Assessment: 41 y/o M transfer from [facility] with abdominal pain and atrial flutter. Heparin level came back at 0.64 on 1000 units/hr. No issues with infusion per nursing. CBC stable, no overt bleeding or complications noted.  Goal of Therapy:  Heparin level 0.3-0.7 units/ml Monitor platelets by anticoagulation protocol: Yes   Plan:  Continue IV heparin to 1000 units/hr. Recheck heparin level in 6 hrs. Daily heparin level and CBC. F/u plans for further cardiac workup/oral anticoagulation when appropriate.  Doylene Canard, PharmD Clinical Pharmacist  Pager: [phone] Phone: [phone]  01/26/2017 9:57 PM

## 2017-01-26 NOTE — Progress Notes (Signed)
ANTICOAGULATION CONSULT NOTE - Follow Up Consult  Pharmacy Consult for Heparin  Indication: Atrial flutter  No Known Allergies  Patient Measurements: Height: 6' (182.9 cm) Weight: 179 lb 14.3 oz (81.6 kg) IBW/kg (Calculated) : 77.6  Vital Signs: Temp: 98.8 F (37.1 C) (12/09 1200) Temp Source: Oral (12/09 1200) BP: 105/87 (12/09 1200) Pulse Rate: 107 (12/09 1200)  Labs: Recent Labs    01/25/17 1650 01/26/17 0212 01/26/17 1047  HGB 16.0 15.0  --   HCT 46.8 43.8  --   PLT 243 226  --   HEPARINUNFRC  --  0.85* 0.77*  CREATININE 1.14 1.08  --   TROPONINI 0.09* 0.06* 0.07*    Estimated Creatinine Clearance: 100.8 mL/min (by C-G formula based on SCr of 1.08 mg/dL).  Assessment: 41 y/o M transfer from [facility] with abdominal pain and atrial flutter. On heparin drip at 1150 units/hr. Heparin level remains elevated despite earlier rate reduction.  CBC stable, no overt bleeding or complications noted.  Goal of Therapy:  Heparin level 0.3-0.7 units/ml Monitor platelets by anticoagulation protocol: Yes   Plan:  Decrease IV heparin to 1000 units/hr. Recheck heparin level in 6 hrs. Daily heparin level and CBC. F/u plans for further cardiac workup/oral anticoagulation when appropriate.  Uvaldo Rising, BCPS  Clinical Pharmacist Pager [phone]  01/26/2017 2:27 PM

## 2017-01-26 NOTE — H&P (Signed)
History and Physical    Devin [name]. PYP:[id number] DOB: 08-06-1975 DOA: 01/25/2017  PCP: Shelda Pal, DO  Patient coming from: Home.  Chief Complaint: Epigastric pain.  HPI: [name]. is a 41 y.o. male with history of hypertension and alcohol abuse presents to the ER admits in [facility] with complaints of epigastric pain.  Patient's pain started off yesterday morning.  Had some nausea denies any vomiting or diarrhea.  Patient states he drank more than usual amount of alcohol the previous day.  He denies any chest pain or palpitations.  Has had some exertional shortness of breath last 1 week.  Patient's epigastric pain was sharp nonradiating.  Patient has a history of palpitations and was evaluated by cardiologist as per the patient 2 years ago.  Patient was placed on Coreg which was eventually discontinued.  Patient is presently only on lisinopril for blood pressure.  ED Course: In the ER patient's labs only show minimally patient was found to be tachycardic and EKG showed atrial flutter with RVR.  On-call cardiologist was consulted by ER physician and patient was started on heparin and Cardizem infusion.  At the time of my exam patient still in atrial flutter but rate controlled at 90/min.  Patient's epigastric pain has improved.  Patient at this time has no chest pain or shortness of breath.  Review of Systems: As per HPI, rest all negative.   Past Medical History:  Diagnosis Date  . Hypertension     Past Surgical History:  Procedure Laterality Date  . NO PAST SURGERIES       reports that he has been smoking cigarettes.  He has a 8.80 pack-year smoking history. he has never used smokeless tobacco. He reports that he drinks alcohol. He reports that he does not use drugs.  No Known Allergies  Family History  Problem Relation Age of Onset  . Diabetes Mother   . Hypertension Mother   . Hypertension Father   . Hypertension Sister   . Asthma Son   .  Cancer Maternal Grandfather   . Cancer Paternal Grandmother        cancer  . Heart attack Paternal Grandfather     Prior to Admission medications   Medication Sig Start Date End Date Taking? Authorizing Provider  lisinopril (PRINIVIL,ZESTRIL) 5 MG tablet Take 1 tablet (5 mg total) by mouth daily. 11/21/16  Yes Shelda Pal, DO  aspirin-acetaminophen-caffeine (EXCEDRIN MIGRAINE) [phone] MG per tablet Take 2 tablets by mouth every 6 (six) hours as needed for headache or migraine.    [provider]    Physical Exam: Vitals:   01/25/17 2145 01/25/17 2252 01/25/17 2259 01/26/17 0000  BP: 104/75 113/88  101/76  Pulse: 68 63    Resp: (!) 27 (!) 23    Temp:  98.5 F (36.9 C)    TempSrc:  Oral    SpO2: 98% 96%    Weight:   81.6 kg (179 lb 14.3 oz)   Height:   6' (1.829 m)       Constitutional: Moderately built and nourished. Vitals:   01/25/17 2145 01/25/17 2252 01/25/17 2259 01/26/17 0000  BP: 104/75 113/88  101/76  Pulse: 68 63    Resp: (!) 27 (!) 23    Temp:  98.5 F (36.9 C)    TempSrc:  Oral    SpO2: 98% 96%    Weight:   81.6 kg (179 lb 14.3 oz)   Height:   6' (1.829  m)    Eyes: Anicteric no pallor. ENMT: No discharge from the ears eyes nose or mouth. Neck: No mass palpated no JVD appreciated. Respiratory: No rhonchi or crepitations. Cardiovascular: S1-S2 heard no murmurs appreciated. Abdomen: Soft nontender bowel sounds present. Musculoskeletal: No edema.  No joint effusion. Skin: No rash. Neurologic: Alert awake oriented to time place and person.  Moves all extremities. Psychiatric: Appears normal.  Normal affect.   Labs on Admission: I have personally reviewed following labs and imaging studies  CBC: Recent Labs  Lab 01/25/17 1650  WBC 6.2  NEUTROABS 4.4  HGB 16.0  HCT 46.8  MCV 85.6  PLT 161   Basic Metabolic Panel: Recent Labs  Lab 01/25/17 1650  NA 138  K 4.4  CL 107  CO2 23  GLUCOSE 118*  BUN 11  CREATININE 1.14    CALCIUM 8.9  MG 2.2  PHOS 3.5   GFR: Estimated Creatinine Clearance: 95.5 mL/min (by C-G formula based on SCr of 1.14 mg/dL). Liver Function Tests: Recent Labs  Lab 01/25/17 1650  AST 54*  ALT 78*  ALKPHOS 132*  BILITOT 0.8  PROT 7.0  ALBUMIN 3.8   Recent Labs  Lab 01/25/17 1650  LIPASE 33   No results for input(s): AMMONIA in the last 168 hours. Coagulation Profile: No results for input(s): INR, PROTIME in the last 168 hours. Cardiac Enzymes: Recent Labs  Lab 01/25/17 1650  TROPONINI 0.09*   BNP (last 3 results) No results for input(s): PROBNP in the last 8760 hours. HbA1C: No results for input(s): HGBA1C in the last 72 hours. CBG: No results for input(s): GLUCAP in the last 168 hours. Lipid Profile: No results for input(s): CHOL, HDL, LDLCALC, TRIG, CHOLHDL, LDLDIRECT in the last 72 hours. Thyroid Function Tests: No results for input(s): TSH, T4TOTAL, FREET4, T3FREE, THYROIDAB in the last 72 hours. Anemia Panel: No results for input(s): VITAMINB12, FOLATE, FERRITIN, TIBC, IRON, RETICCTPCT in the last 72 hours. Urine analysis: No results found for: COLORURINE, APPEARANCEUR, LABSPEC, PHURINE, GLUCOSEU, HGBUR, BILIRUBINUR, KETONESUR, PROTEINUR, UROBILINOGEN, NITRITE, LEUKOCYTESUR Sepsis Labs: @LABRCNTIP (procalcitonin:4,lacticidven:4) )No results found for this or any previous visit (from the past 240 hour(s)).   Radiological Exams on Admission: Dg Chest 2 View  Result Date: 01/25/2017 CLINICAL DATA:  Shortness of breath and cough EXAM: CHEST  2 VIEW COMPARISON:  March 07, 2014 FINDINGS: There is no edema or consolidation. Heart size and pulmonary vascularity are normal. No adenopathy. No pneumothorax. There is thoracic lordosis. IMPRESSION: No edema or consolidation. Electronically Signed   By: Lowella Grip III M.D.   On: 01/25/2017 17:23   Dg Abd 2 Views  Result Date: 01/25/2017 CLINICAL DATA:  Periumbilical pain and nausea EXAM: ABDOMEN - 2 VIEW  COMPARISON:  None. FINDINGS: Supine and upright images were obtained. There is moderate fluid and food material in stomach. There is moderate stool in colon. There is no appreciable bowel dilatation or air-fluid level to suggest bowel obstruction. No free air. There are tiny phleboliths in the pelvis. Lung bases are clear. IMPRESSION: Moderate stool in colon.  No bowel obstruction or free air evident. Electronically Signed   By: Lowella Grip III M.D.   On: 01/25/2017 20:07    EKG: Independently reviewed.  Atrial flutter with RVR.  Assessment/Plan Principal Problem:   Atrial flutter with rapid ventricular response (HCC) Active Problems:   Essential hypertension   Epigastric pain   Alcohol abuse    1. Atrial flutter with RVR -patient is presently on Cardizem infusion.  Patient  also started on heparin infusion for anticoagulation.  Chads 2 vasc score is 1 for hypertension.  I did discuss with on-call cardiologist will see patient in consult.  Will check cardiac markers thyroid function test and 2D echo. 2. Epigastric pain with a history of alcoholism -among the differential include alcoholic gastritis.  Will check CT abdomen and pelvis.  Recheck LFTs and lipase in the morning.  Patient is placed on Protonix.  For now n.p.o. 3. Alcohol abuse -patient advised to quit drinking.  Patient is on stepdown CIWA protocol. 4. Tobacco abuse patient advised to quit smoking. 5. Hypertension -usually takes lisinopril presently on Cardizem infusion.  Follow blood pressure trends.  I have reviewed patient's old charts.   DVT prophylaxis: Heparin. Code Status: Full code. Family Communication: Patient's wife. Disposition Plan: Home. Consults called: Cardiology. Admission status: Inpatient.   Rise Patience MD [facility] Pager [phone].  If 7PM-7AM, please contact night-coverage [email or web address] Password [email or web address]  01/26/2017, 12:52 AM

## 2017-01-27 ENCOUNTER — Inpatient Hospital Stay (HOSPITAL_COMMUNITY): Payer: Commercial Managed Care - PPO | Admitting: Anesthesiology

## 2017-01-27 ENCOUNTER — Inpatient Hospital Stay (HOSPITAL_COMMUNITY): Payer: Commercial Managed Care - PPO

## 2017-01-27 ENCOUNTER — Encounter (HOSPITAL_COMMUNITY)
Admission: EM | Disposition: A | Discharge status: Home / Self Care | Payer: Self-pay | Source: Home / Self Care | Attending: Internal Medicine

## 2017-01-27 ENCOUNTER — Encounter (HOSPITAL_COMMUNITY): Payer: Self-pay | Admitting: *Deleted

## 2017-01-27 DIAGNOSIS — I5043 Acute on chronic combined systolic (congestive) and diastolic (congestive) heart failure: Secondary | ICD-10-CM

## 2017-01-27 DIAGNOSIS — I517 Cardiomegaly: Secondary | ICD-10-CM

## 2017-01-27 DIAGNOSIS — I4892 Unspecified atrial flutter: Secondary | ICD-10-CM

## 2017-01-27 HISTORY — PX: CARDIOVERSION: SHX1299

## 2017-01-27 HISTORY — PX: TEE WITHOUT CARDIOVERSION: SHX5443

## 2017-01-27 LAB — COMPREHENSIVE METABOLIC PANEL
ALBUMIN: 3.6 g/dL (ref 3.5–5.0)
ALK PHOS: 114 U/L (ref 38–126)
ALT: 52 U/L (ref 17–63)
ANION GAP: 12 (ref 5–15)
AST: 32 U/L (ref 15–41)
BILIRUBIN TOTAL: 0.8 mg/dL (ref 0.3–1.2)
BUN: 6 mg/dL (ref 6–20)
CO2: 22 mmol/L (ref 22–32)
Calcium: 8.6 mg/dL — ABNORMAL LOW (ref 8.9–10.3)
Chloride: 104 mmol/L (ref 101–111)
Creatinine, Ser: 1.27 mg/dL — ABNORMAL HIGH (ref 0.61–1.24)
GFR calc Af Amer: >60 mL/min (ref >60)
GFR calc non Af Amer: >60 mL/min (ref >60)
Glucose, Bld: 111 mg/dL — ABNORMAL HIGH (ref 65–99)
Potassium: 4.4 mmol/L (ref 3.5–5.1)
SODIUM: 138 mmol/L (ref 135–145)
Total Protein: 6.5 g/dL (ref 6.5–8.1)

## 2017-01-27 LAB — TROPONIN I
TROPONIN I: 0.07 ng/mL — AB (ref <0.03)
Troponin I: 0.06 ng/mL (ref <0.03)
Troponin I: 0.06 ng/mL (ref <0.03)

## 2017-01-27 LAB — CBC
HEMATOCRIT: 47 % (ref 39.0–52.0)
HEMOGLOBIN: 15.7 g/dL (ref 13.0–17.0)
MCH: 29.3 pg (ref 26.0–34.0)
MCHC: 33.4 g/dL (ref 30.0–36.0)
MCV: 87.7 fL (ref 78.0–100.0)
Platelets: 227 10*3/uL (ref 150–400)
RBC: 5.36 MIL/uL (ref 4.22–5.81)
RDW: 15.1 % (ref 11.5–15.5)
WBC: 11.6 10*3/uL — ABNORMAL HIGH (ref 4.0–10.5)

## 2017-01-27 LAB — HEPARIN LEVEL (UNFRACTIONATED): HEPARIN UNFRACTIONATED: 0.4 [IU]/mL (ref 0.30–0.70)

## 2017-01-27 SURGERY — ECHOCARDIOGRAM, TRANSESOPHAGEAL
Anesthesia: Monitor Anesthesia Care

## 2017-01-27 MED ORDER — FUROSEMIDE 10 MG/ML IJ SOLN
20.0000 mg | Freq: Once | INTRAMUSCULAR | Status: AC
  Administered 2017-01-27: 20 mg via INTRAVENOUS
  Filled 2017-01-27: qty 2

## 2017-01-27 MED ORDER — APIXABAN 5 MG PO TABS
5.0000 mg | ORAL_TABLET | Freq: Two times a day (BID) | ORAL | Status: DC
  Administered 2017-01-27 – 2017-01-28 (×3): 5 mg via ORAL
  Filled 2017-01-27 (×3): qty 1

## 2017-01-27 MED ORDER — LACTATED RINGERS IV SOLN
INTRAVENOUS | Status: DC | PRN
  Administered 2017-01-27: 13:00:00 via INTRAVENOUS

## 2017-01-27 MED ORDER — PROPOFOL 500 MG/50ML IV EMUL
INTRAVENOUS | Status: DC | PRN
  Administered 2017-01-27: 150 ug/kg/min via INTRAVENOUS

## 2017-01-27 MED ORDER — SODIUM CHLORIDE 0.9 % IV SOLN: INTRAVENOUS | Status: DC

## 2017-01-27 MED ORDER — AMIODARONE LOAD VIA INFUSION
150.0000 mg | Freq: Once | INTRAVENOUS | Status: AC
  Administered 2017-01-27: 150 mg via INTRAVENOUS
  Filled 2017-01-27: qty 83.34

## 2017-01-27 MED ORDER — AMIODARONE IV BOLUS ONLY 150 MG/100ML: 150.0000 mg | Freq: Once | INTRAVENOUS | Status: DC

## 2017-01-27 MED ORDER — AMIODARONE HCL IN DEXTROSE 360-4.14 MG/200ML-% IV SOLN
60.0000 mg/h | INTRAVENOUS | Status: AC
  Administered 2017-01-27 (×2): 60 mg/h via INTRAVENOUS
  Filled 2017-01-27 (×2): qty 200

## 2017-01-27 MED ORDER — BISMUTH SUBSALICYLATE 262 MG/15ML PO SUSP
30.0000 mL | Freq: Four times a day (QID) | ORAL | Status: DC | PRN
  Filled 2017-01-27: qty 118

## 2017-01-27 MED ORDER — CARVEDILOL 3.125 MG PO TABS
3.1250 mg | ORAL_TABLET | Freq: Two times a day (BID) | ORAL | Status: DC
  Administered 2017-01-27 – 2017-01-28 (×2): 3.125 mg via ORAL
  Filled 2017-01-27 (×2): qty 1

## 2017-01-27 MED ORDER — LISINOPRIL 10 MG PO TABS
10.0000 mg | ORAL_TABLET | Freq: Every day | ORAL | Status: DC
  Administered 2017-01-27 – 2017-01-28 (×2): 10 mg via ORAL
  Filled 2017-01-27 (×2): qty 1

## 2017-01-27 MED ORDER — AMIODARONE HCL IN DEXTROSE 360-4.14 MG/200ML-% IV SOLN
30.0000 mg/h | INTRAVENOUS | Status: DC
  Filled 2017-01-27: qty 200

## 2017-01-27 MED ORDER — CALCIUM CARBONATE ANTACID 500 MG PO CHEW: 1.0000 | CHEWABLE_TABLET | Freq: Every day | ORAL | Status: DC | PRN

## 2017-01-27 NOTE — Anesthesia Preprocedure Evaluation (Addendum)
Anesthesia Evaluation  Patient identified by MRN, date of birth, ID band Patient awake    Reviewed: Allergy & Precautions, NPO status , Patient's Chart, lab work & pertinent test results  Airway Mallampati: II  TM Distance: >3 FB Neck ROM: Full    Dental no notable dental hx. (+) Teeth Intact, Dental Advisory Given   Pulmonary Current Smoker,    Pulmonary exam normal breath sounds clear to auscultation       Cardiovascular hypertension, Pt. on medications + dysrhythmias Atrial Fibrillation  Rhythm:Irregular Rate:Tachycardia  ECG: A-flutter, rate 83   Neuro/Psych negative neurological ROS  negative psych ROS   GI/Hepatic negative GI ROS, (+)     substance abuse  alcohol use,   Endo/Other  negative endocrine ROS  Renal/GU negative Renal ROS     Musculoskeletal negative musculoskeletal ROS (+)   Abdominal   Peds  Hematology negative hematology ROS (+)   Anesthesia Other Findings Hyperlipidemia  Reproductive/Obstetrics                            Anesthesia Physical Anesthesia Plan  ASA: IV  Anesthesia Plan: MAC   Post-op Pain Management:    Induction: Intravenous  PONV Risk Score and Plan: 0 and Propofol infusion and Treatment may vary due to age or medical condition  Airway Management Planned: Natural Airway  Additional Equipment:   Intra-op Plan:   Post-operative Plan:   Informed Consent: I have reviewed the patients History and Physical, chart, labs and discussed the procedure including the risks, benefits and alternatives for the proposed anesthesia with the patient or authorized representative who has indicated his/her understanding and acceptance.   Dental advisory given  Plan Discussed with: CRNA  Anesthesia Plan Comments:        Anesthesia Quick Evaluation

## 2017-01-27 NOTE — Transfer of Care (Signed)
Immediate Anesthesia Transfer of Care Note  Patient: Devin [name].  Procedure(s) Performed: TRANSESOPHAGEAL ECHOCARDIOGRAM (TEE) (N/A ) CARDIOVERSION (N/A )  Patient Location: PACU and Endoscopy Unit  Anesthesia Type:MAC  Level of Consciousness: patient cooperative and responds to stimulation  Airway & Oxygen Therapy: Patient Spontanous Breathing and Patient connected to face mask oxygen  Post-op Assessment: Report given to RN and Post -op Vital signs reviewed and stable  Post vital signs: Reviewed and stable  Last Vitals:  Vitals:   01/27/17 0838 01/27/17 1130  BP:  129/82  Pulse:  (!) 135  Resp:  (!) 35  Temp:  37.3 C  SpO2: 94% 96%    Last Pain:  Vitals:   01/27/17 1130  TempSrc: Oral  PainSc:          Complications: No apparent anesthesia complications

## 2017-01-27 NOTE — Significant Event (Signed)
Patient's nurse notified me that patient was getting short of breath. Patient blood pressure was 00-459 systolic.  Rhythm was still in atrial flutter with rates around 130-140.  Respiratory rate was 30/min and O2 sat was 80% on room air and had required 4 L to maintain sats. On exam at bedside patient has bilateral crepitations.  Denies any chest pain. Chest x-ray shows congestion. BNP was around 174 lactic acid 2.  Troponin pending.  LFTs noted.  Recent 2D echo noted.  Discussed with on-call cardiologist Dr. Harrell Gave. Since patient appears congested and still tachycardic with low normal blood pressure cardiologist advised discontinuing Cardizem and switching to amiodarone infusion.  Lasix 20 mg IV.  Patient and patient's family updated about the plan.   Discussed with patient's nurse about the plans.  Gean Birchwood.

## 2017-01-27 NOTE — Progress Notes (Signed)
Echocardiogram Transesophageal has been performed.  Devin [name] 01/27/2017, 1:07 PM

## 2017-01-27 NOTE — Interval H&P Note (Signed)
History and Physical Interval Note:  01/27/2017 11:36 AM  Devin [name].  has presented today for surgery, with the diagnosis of atrial fibrillation  The various methods of treatment have been discussed with the patient and family. After consideration of risks, benefits and other options for treatment, the patient has consented to  Procedure(s): TRANSESOPHAGEAL ECHOCARDIOGRAM (TEE) (N/A) CARDIOVERSION (N/A) as a surgical intervention .  The patient's history has been reviewed, patient examined, no change in status, stable for surgery.  I have reviewed the patient's chart and labs.  Questions were answered to the patient's satisfaction.     Roverto Bodmer

## 2017-01-27 NOTE — Progress Notes (Signed)
Devin [name] - Stepdown/ICU TEAM  Devin [name].  BJS:[id number] DOB: 1975/06/30 DOA: 01/25/2017 PCP: Devin Pal, DO    Brief Narrative:  41 y.o. male with hx of hypertension and alcohol abuse who presented to the ER with epigastric pain.    In the ER the patient was found to be tachycardic and EKG showed atrial flutter with RVR.  Cardiology was consulted and the patient was started on heparin and Cardizem infusion.   Subjective: Pt experienced signif sob last night, accompanied by RVR not able to be controlled by cardizem.  Amio was initiated.  TTE has noted severe systolic CHF.  Feels much better this am, despite HR 130.  Denies cp, sob, or abdom pain at this time.  Is hungry and wants to go home asap.    Assessment & Plan:  Atrial flutter with RVR  Rate is proving very difficult to control - CHA2DS2 VASc score is now 2 given CHF - heparin gtt > eliquis - amio started last night - TSH is low (see below) - Cards following - for TEE DCCV today    Newly diagnosed Acute systolic CHF Rate related v/s EtOH CM - counseled pt on need for tx to maximize potential to recover - diurese given pulmonary edema - rate control of upmost importance   Epigastric pain with a history of alcoholism Lipase normal - CT not striking - RUQ [place] notes only GB wall thickening but no gallstones or evidence of cholecystitis - suspect his sx are due to EtOH gastritis - sx now resolved   Acute kidney injury crt has climbed since admit in setting of hypotension related to RVR and CHF - follow - avoid ACEi/ARB and NSAIDs for now  Recent Labs  Lab 01/25/17 1650 01/26/17 0212 01/27/17 0446  CREATININE [name].14 [name].08 [name].27*    Low TSH TSH low this admit - also low 12/06/16 as outpt, but free T4 at that time was normal at [name].2 - free T4 this admit also normal at 0.97 - T3 pending to r/o isolated T3-toxicosis    Alcohol abuse - 4-5 beers/day patient advised to quit drinking - no evidence of withdrawal     Tobacco abuse - [name]/2 PPD advised to quit smoking  Hypertension usually takes lisinopril - holding for now until renal fxn improved    DVT prophylaxis: heparin gtt > eliquis  Code Status: FULL CODE Family Communication: spoke w/ signif other at bedside Disposition Plan: SDU   Consultants:  [facility]   Antimicrobials:  none   Objective: Blood pressure 110/72, pulse (!) 135, temperature 98.7 F (37.[name] C), temperature source Oral, resp. rate (!) 39, height 6' ([name].829 m), weight 81.6 kg (179 lb 14.3 oz), SpO2 94 %.  Intake/Output Summary (Last 24 hours) at 01/27/2017 1011 Last data filed at 01/27/2017 0500 Gross per 24 hour  Intake 458.56 ml  Output 1200 ml  Net -741.44 ml   Filed Weights   01/25/17 1631 01/25/17 2259  Weight: 84.8 kg (187 lb) 81.6 kg (179 lb 14.3 oz)    Examination: General: No acute respiratory distress at rest in bed  Lungs: fine diffuse crackles - no wheezing  Cardiovascular: Regular rate and rhythm without murmur gallop or rub normal S1 and S2 Abdomen: Nontender, nondistended, soft, bowel sounds positive, no rebound, no ascites, no appreciable mass Extremities: No significant cyanosis, clubbing, or edema bilateral lower extremities   CBC: Recent Labs  Lab 01/25/17 1650 01/26/17 0212 01/27/17 0446  WBC 6.2 8.8 11.6*  NEUTROABS 4.4  --   --   HGB 16.0 15.0 15.7  HCT 46.8 43.8 47.0  MCV 85.6 87.4 87.7  PLT 243 226 235   Basic Metabolic Panel: Recent Labs  Lab 01/25/17 1650 01/26/17 0212 01/27/17 0446  NA 138 136 138  K 4.4 4.6 4.4  CL 107 108 104  CO2 23 21* 22  GLUCOSE 118* 123* 111*  BUN 11 6 6   CREATININE [name].14 [name].08 [name].27*  CALCIUM 8.9 9.2 8.6*  MG 2.2  --   --   PHOS 3.5  --   --    GFR: Estimated Creatinine Clearance: 85.7 mL/min (A) (by C-G formula based on SCr of [name].27 mg/dL (H)).  Liver Function Tests: Recent Labs  Lab 01/25/17 1650 01/26/17 0212 01/27/17 0446  AST 54* 38 32  ALT 78* 61 52  ALKPHOS 132* 113  114  BILITOT 0.8 [name].0 0.8  PROT 7.0 5.9* 6.5  ALBUMIN 3.8 3.3* 3.6   Recent Labs  Lab 01/25/17 1650 01/26/17 0212  LIPASE 33 32    Cardiac Enzymes: Recent Labs  Lab 01/25/17 1650 01/26/17 0212 01/26/17 1047 01/26/17 2249 01/27/17 0446  TROPONINI 0.09* 0.06* 0.07* 0.07* 0.06*    HbA1C: Hgb A1c MFr Bld  Date/Time Value Ref Range Status  11/21/2016 09:09 AM 5.7 4.6 - 6.5 % Final    Comment:    Glycemic Control Guidelines for People with Diabetes:Non Diabetic:  <6%Goal of Therapy: <7%Additional Action Suggested:  >8%     CBG: No results for input(s): GLUCAP in the last 168 hours.  Recent Results (from the past 240 hour(s))  MRSA PCR Screening     Status: None   Collection Time: 01/25/17 10:43 PM  Result Value Ref Range Status   MRSA by PCR NEGATIVE NEGATIVE Final    Comment:        The GeneXpert MRSA Assay (FDA approved for NASAL specimens only), is one component of a comprehensive MRSA colonization surveillance program. It is not intended to diagnose MRSA infection nor to guide or monitor treatment for MRSA infections.      Scheduled Meds: . apixaban  5 mg Oral BID  . aspirin EC  81 mg Oral Daily  . folic acid  [name] mg Oral Daily  . pantoprazole  40 mg Oral Q1200  . pneumococcal 23 valent vaccine  0.5 mL Intramuscular Tomorrow-1000  . thiamine  100 mg Oral Daily   Continuous Infusions: . sodium chloride    . amiodarone 30 mg/hr (01/27/17 0615)     LOS: 2 days   Time spent: No Charge  Cherene Altes, MD [facility] Office  [phone] Pager - Text Page per Amion as per below:  On-Call/Text Page:      Devin [name]      password [email or web address]  If 7PM-7AM, please contact night-coverage [email or web address] Password [email or web address] 01/27/2017, 10:11 AM

## 2017-01-27 NOTE — Op Note (Signed)
Procedure: Electrical Cardioversion Indications:  Atrial Flutter  Procedure Details:  Consent: Risks of procedure as well as the alternatives and risks of each were explained to the (patient/caregiver).  Consent for procedure obtained.  Time Out: Verified patient identification, verified procedure, site/side was marked, verified correct patient position, special equipment/implants available, medications/allergies/relevent history reviewed, required imaging and test results available.  Performed  Patient placed on cardiac monitor, pulse oximetry, supplemental oxygen as necessary.  Sedation given: IV propofol, Dr. Roanna Banning Pacer pads placed anterior and posterior chest.  Cardioverted 1 time(s).  Cardioversion with synchronized biphasic 150J shock.  Evaluation: Findings: Post procedure EKG shows: NSR Complications: None Patient did tolerate procedure well.  Time Spent Directly with the Patient:  30 minutes   Devin [name] 01/27/2017, 12:58 PM

## 2017-01-27 NOTE — Anesthesia Postprocedure Evaluation (Signed)
Anesthesia Post Note  Patient: Devin [name].  Procedure(s) Performed: TRANSESOPHAGEAL ECHOCARDIOGRAM (TEE) (N/A ) CARDIOVERSION (N/A )     Patient location during evaluation: Endoscopy Anesthesia Type: MAC Level of consciousness: awake and alert Pain management: pain level controlled Vital Signs Assessment: post-procedure vital signs reviewed and stable Respiratory status: spontaneous breathing, nonlabored ventilation, respiratory function stable and patient connected to nasal cannula oxygen Cardiovascular status: stable and blood pressure returned to baseline Postop Assessment: no apparent nausea or vomiting Anesthetic complications: no Comments: No antiemetics given due to MAC procedure, and no patient complaint of nausea/vomiting.     Last Vitals:  Vitals:   01/27/17 1305 01/27/17 1320  BP: 117/83 131/87  Pulse:  99  Resp: (!) 25 (!) 35  Temp: 36.8 C   SpO2: 94% 92%    Last Pain:  Vitals:   01/27/17 1305  TempSrc: Oral  PainSc:                  [name]

## 2017-01-27 NOTE — H&P (View-Only) (Signed)
Progress Note  Patient Name: Devin [name]. Date of Encounter: 01/27/2017  Primary Cardiologist: New  Subjective   Breathing slightly improved this AM from overnight. Dry cough remains. No CP or palpitations. Slightly anxious about procedure this afternoon, however understands the need.  Inpatient Medications    Scheduled Meds: . aspirin EC  81 mg Oral Daily  . folic acid  1 mg Oral Daily  . pantoprazole  40 mg Oral Q1200  . pneumococcal 23 valent vaccine  0.5 mL Intramuscular Tomorrow-1000  . thiamine  100 mg Oral Daily   Continuous Infusions: . amiodarone 30 mg/hr (01/27/17 0615)  . heparin 1,000 Units/hr (01/26/17 1505)   PRN Meds: acetaminophen **OR** acetaminophen, gi cocktail, guaiFENesin, LORazepam, menthol-cetylpyridinium, morphine injection, ondansetron **OR** ondansetron (ZOFRAN) IV   Vital Signs    Vitals:   01/26/17 2045 01/26/17 2100 01/26/17 2115 01/26/17 2352  BP: 112/89 113/72 94/83   Pulse: 72 (!) 125 (!) 112   Resp: (!) 26 (!) 36 (!) 35   Temp:      TempSrc:      SpO2: (!) 88% 93% (!) 87% 93%  Weight:      Height:        Intake/Output Summary (Last 24 hours) at 01/27/2017 0758 Last data filed at 01/27/2017 0500 Gross per 24 hour  Intake 458.56 ml  Output 1200 ml  Net -741.44 ml   Filed Weights   01/25/17 1631 01/25/17 2259  Weight: 187 lb (84.8 kg) 179 lb 14.3 oz (81.6 kg)    Physical Exam   General: Well developed, well nourished, NAD Skin: Warm, dry, intact  Head: Normocephalic, atraumatic, clear, moist mucus membranes. Neck: Negative for carotid bruits. No JVD Lung: Bilateral anterior friction rales. No wheezes or rhonchi. Breathing is unlabored. Cardiovascular: Irregular with S1 S2. No murmurs, rubs, or gallops. Abdomen: Soft, non-tender, non-distended with normoactive bowel sounds. No obvious abdominal masses. MSK: Strength and tone appear normal for age. 5/5 in all extremities Extremities: No edema. No clubbing or  cyanosis. DP/PT pulses 2+ bilaterally Neuro: Alert and oriented. No focal deficits. No facial asymmetry. MAE spontaneously. Psych: Responds to questions appropriately with normal affect.    Labs    Chemistry Recent Labs  Lab 01/25/17 1650 01/26/17 0212 01/27/17 0446  NA 138 136 138  K 4.4 4.6 4.4  CL 107 108 104  CO2 23 21* 22  GLUCOSE 118* 123* 111*  BUN 11 6 6   CREATININE 1.14 1.08 1.27*  CALCIUM 8.9 9.2 8.6*  PROT 7.0 5.9* 6.5  ALBUMIN 3.8 3.3* 3.6  AST 54* 38 32  ALT 78* 61 52  ALKPHOS 132* 113 114  BILITOT 0.8 1.0 0.8  GFRNONAA >60 >60 >60  GFRAA >60 >60 >60  ANIONGAP 8 7 12      Hematology Recent Labs  Lab 01/25/17 1650 01/26/17 0212 01/27/17 0446  WBC 6.2 8.8 11.6*  RBC 5.47 5.01 5.36  HGB 16.0 15.0 15.7  HCT 46.8 43.8 47.0  MCV 85.6 87.4 87.7  MCH 29.3 29.9 29.3  MCHC 34.2 34.2 33.4  RDW 15.5 15.0 15.1  PLT 243 226 227    Cardiac Enzymes Recent Labs  Lab 01/26/17 0212 01/26/17 1047 01/26/17 2249 01/27/17 0446  TROPONINI 0.06* 0.07* 0.07* 0.06*   No results for input(s): TROPIPOC in the last 168 hours.   BNP Recent Labs  Lab 01/26/17 0212 01/26/17 2249  BNP 345.4* 174.0*     DDimer  Recent Labs  Lab 01/26/17 2249  DDIMER  0.33     Radiology    Dg Chest 2 View  Result Date: 01/25/2017 CLINICAL DATA:  Shortness of breath and cough EXAM: CHEST  2 VIEW COMPARISON:  March 07, 2014 FINDINGS: There is no edema or consolidation. Heart size and pulmonary vascularity are normal. No adenopathy. No pneumothorax. There is thoracic lordosis. IMPRESSION: No edema or consolidation. Electronically Signed   By: Lowella Grip III M.D.   On: 01/25/2017 17:23   Ct Abdomen Pelvis W Contrast  Result Date: 01/26/2017 CLINICAL DATA:  Abdominal distension EXAM: CT ABDOMEN AND PELVIS WITH CONTRAST TECHNIQUE: Multidetector CT imaging of the abdomen and pelvis was performed using the standard protocol following bolus administration of intravenous  contrast. CONTRAST:  183mL ISOVUE-300 IOPAMIDOL (ISOVUE-300) INJECTION 61% COMPARISON:  Abdominal radiographs, 01/25/2017 FINDINGS: Lower chest: Minimal, right greater than left, pleural effusions. Heart borderline enlarged. Hepatobiliary: Liver unremarkable. Gallbladder is mildly distended. Wall appears mildly thickened to 3-4 mm. No stones. No bile duct dilation. Pancreas: Unremarkable. No pancreatic ductal dilatation or surrounding inflammatory changes. Spleen: Normal in size without focal abnormality. Adrenals/Urinary Tract: Adrenal glands are unremarkable. Kidneys are normal, without renal calculi, focal lesion, or hydronephrosis. Bladder is unremarkable. Stomach/Bowel: Stomach and small bowel unremarkable. No colonic dilation, wall thickening or inflammation. No significant increase in stool. Normal appendix visualized. Vascular/Lymphatic: No significant vascular findings are present. No enlarged abdominal or pelvic lymph nodes. Reproductive: Unremarkable. Other: Mild perinephric edema is seen bilaterally. Trace amount of fluid attenuation is seen adjacent to the gallbladder near Morison's pouch. No abdominal wall hernia. Musculoskeletal: No acute or significant osseous findings. IMPRESSION: 1. Gallbladder wall appears borderline thickened. This is nonspecific. If there are symptoms of acute cholecystitis, follow-up right upper quadrant limited ultrasound would be recommended. 2. Trace amount of peritoneal fluid seen adjacent to the gallbladder. Mild perinephric edema is seen bilaterally. Trace, right greater than left pleural effusions. 3. No other abnormalities. No significant bowel distention to account for abdominal distension. Electronically Signed   By: Lajean Manes M.D.   On: 01/26/2017 07:24   Dg Chest Port 1 View  Result Date: 01/26/2017 CLINICAL DATA:  Acute onset of increased respiratory rate and decreased O2 saturation. Atrial flutter. EXAM: PORTABLE CHEST 1 VIEW COMPARISON:  Chest radiograph  performed 01/25/2017 FINDINGS: The lungs are well-aerated. Mild vascular congestion is noted. There is no evidence of focal opacification, pleural effusion or pneumothorax. The cardiomediastinal silhouette is borderline normal in size. No acute osseous abnormalities are seen. IMPRESSION: Mild vascular congestion noted.  Lungs otherwise clear. Electronically Signed   By: Garald Balding M.D.   On: 01/26/2017 23:05   Dg Abd 2 Views  Result Date: 01/25/2017 CLINICAL DATA:  Periumbilical pain and nausea EXAM: ABDOMEN - 2 VIEW COMPARISON:  None. FINDINGS: Supine and upright images were obtained. There is moderate fluid and food material in stomach. There is moderate stool in colon. There is no appreciable bowel dilatation or air-fluid level to suggest bowel obstruction. No free air. There are tiny phleboliths in the pelvis. Lung bases are clear. IMPRESSION: Moderate stool in colon.  No bowel obstruction or free air evident. Electronically Signed   By: Lowella Grip III M.D.   On: 01/25/2017 20:07   US Abdomen Limited Ruq  Result Date: 01/26/2017 CLINICAL DATA:  Abdominal pain since since yesterday. EXAM: ULTRASOUND ABDOMEN LIMITED RIGHT UPPER QUADRANT COMPARISON:  None. FINDINGS: Gallbladder: No gallstones visualized. The gallbladder wall is thickened. No pericholecystic fluid or sonographic Murphy sign noted by sonographer. Common bile duct: Diameter:  0.2 cm. Liver: No focal lesion identified. Within normal limits in parenchymal echogenicity. Portal vein is patent on color Doppler imaging with normal direction of blood flow towards the liver. Other: Right pleural effusion. Trace amount of perihepatic ascites is noted. IMPRESSION: Gallbladder wall thickening without gallstones or evidence of cholecystitis could be due to hypoalbuminemia or small volume of perihepatic ascites. Negative for biliary ductal dilatation. Small right pleural effusion. Electronically Signed   By: Inge Rise M.D.   On:  01/26/2017 12:18    Telemetry    01/27/17- Aflutter 130- Personally Reviewed  ECG    01/25/17-Aflutter with RVR - Personally Reviewed  Cardiac Studies   Echocardiogram  01/26/2017:  LV EF: 20% -   25%  ------------------------------------------------------------------- History:   PMH:  Atrial Flutter.  Risk factors:  ETOH Abuse. Current tobacco use. Hypertension.  ------------------------------------------------------------------- Study Conclusions  - Left ventricle: The cavity size was mildly dilated. Wall   thickness was increased in a pattern of mild LVH. Systolic   function was severely reduced. The estimated ejection fraction   was in the range of 20% to 25%. Diffuse hypokinesis. Doppler   parameters are consistent with restrictive physiology, indicative   of decreased left ventricular diastolic compliance and/or   increased left atrial pressure. Doppler parameters are consistent   with high ventricular filling pressure. - Aortic valve: Valve area (VTI): 2.01 cm^2. Valve area (Vmax):   2.21 cm^2. Valve area (Vmean): 2.09 cm^2. - Mitral valve: Mildly calcified annulus. Mildly thickened leaflets   . There was mild to moderate regurgitation. - Left atrium: The atrium was mildly dilated. - Right ventricle: The cavity size was mildly dilated. Systolic   function was moderately reduced. - Pulmonary arteries: Systolic pressure was moderately increased.   PA peak pressure: 44 mm Hg (S). - Technically adequate study.  Patient Profile     41 y.o. male with a hx of hypertension, HLD and alcohol abuse who is being seen today for the evaluation of atrial flutter at the request of Dr. Thereasa Solo.  Assessment & Plan    1. Aflutter: -Unknown duration. Pt has been symptomatic with signs of orthopnea and DOE for the last two weeks, increasing in severity on 01/25/17. No CP or palpitations noted.  -TSH WNL -Pt started on Diltiazem with slight improvements in rates, however,  overnight 01/27/17, pt became more symptomatic with lower BP. IM changed to Amiodarone gtt. Remains in [place] 120-130's. Less symptomatic. Additional 150mg  bolus given this AM.  -Currently on Heparin gtt -He has been scheduled for a TEE DCCV today, 01/27/17  -This patients CHA2DS2-VASc Score and unadjusted Ischemic Stroke Rate (% per year) is equal to 2.2 % stroke rate/year from a score of 2 Above score calculated as 1 point each if present [CHF, HTN, DM, Vascular=MI/PAD/Aortic Plaque, Age if 65-74, or Male], 2 points each if present [Age > 75, or Stroke/TIA/TE]  2. Systolic Heart Faliure:  -Pt with symptomatic orthopnea most likely r/t Aflutter with RVR. -Current alcohol abuse -EF per Echo 01/26/2017 20-25% -CXR this AM showed mild vascular congestion, 20mg  IV lasix given with good UO per IM -BNP improved 345>>>174  3. Hypertension: -Treated with lisinopril 5 mg daily (has been treated with Coreg as well in the past). Currently on hold. -Normotensive, stable 94/83, 110/72  4. Hyperlidemia:  -LDL 11 on 11/21/2016 -Treated with fenofibrate in the past. D/C this on his own  5. Tobacco abuse: -1/2 PPD smoker -Smoking cessation encouraged  6. Alcohol abuse: -4-5 beers per day -On  CIWA protocol per IM  Signed, Kathyrn Drown, NP  01/27/2017, 7:58 AM    For questions or updates, please contact   Please consult [email or web address] for contact info under Cardiology/STEMI.  Patient examined chart reviewed DCM either from alcohol or rate. Flutter rate harder to control. On heparin Will start eliquis needs anticoagulation for [facility]least a [facility] Orders written on schedule for TEE/DCC With [facility] today Exam with anxious patient no murmur clear lungs no edema   Jenkins Rouge

## 2017-01-27 NOTE — Progress Notes (Signed)
ANTICOAGULATION CONSULT NOTE - Follow Up Consult  Pharmacy Consult for Heparin  Indication: Atrial flutter  No Known Allergies  Patient Measurements: Height: 6' (182.9 cm) Weight: 179 lb 14.3 oz (81.6 kg) IBW/kg (Calculated) : 77.6  Vital Signs: Temp: 98.7 F (37.1 C) (12/10 0800) Temp Source: Oral (12/10 0800) BP: 110/72 (12/10 0800) Pulse Rate: 135 (12/10 0800)  Labs: Recent Labs    01/25/17 1650  01/26/17 0212 01/26/17 1047 01/26/17 2038 01/26/17 2249 01/27/17 0446  HGB 16.0  --  15.0  --   --   --  15.7  HCT 46.8  --  43.8  --   --   --  47.0  PLT 243  --  226  --   --   --  227  HEPARINUNFRC  --    < > 0.85* 0.77* 0.64  --  0.40  CREATININE 1.14  --  1.08  --   --   --  1.27*  TROPONINI 0.09*  --  0.06* 0.07*  --  0.07* 0.06*   < > = values in this interval not displayed.    Estimated Creatinine Clearance: 85.7 mL/min (A) (by C-G formula based on SCr of 1.27 mg/dL (H)).  Assessment: 41 y/o M transfer from [facility] with abdominal pain and atrial flutter. Heparin level at goal.  Plans are for TEE/DCCV today and pharmacy consulted to dose apixaban -SCr= 1.27, wt ~ 81kg Goal of Therapy:  Heparin level 0.3-0.7 units/ml Monitor platelets by anticoagulation protocol: Yes   Plan:  -Apixaban 5mg  po bid  -Patient is NPO. Spoke to Dr. Johnsie Cancel- procedure is ~ 2pm. May given apixiban now with a sip of water -Will provide patient education  Hildred Laser, Sherian Rein D 01/27/2017 9:43 AM

## 2017-01-27 NOTE — Op Note (Signed)
INDICATIONS: atrial flutter pre-cardioversion  PROCEDURE:   Informed consent was obtained prior to the procedure. The risks, benefits and alternatives for the procedure were discussed and the patient comprehended these risks.  Risks include, but are not limited to, cough, sore throat, vomiting, nausea, somnolence, esophageal and stomach trauma or perforation, bleeding, low blood pressure, aspiration, pneumonia, infection, trauma to the teeth and death.    After a procedural time-out, the oropharynx was anesthetized with 20% benzocaine spray.   During this procedure the patient was administered IV propofol (Dr. Roanna Banning).  The transesophageal probe was inserted in the esophagus and stomach without difficulty and multiple views were obtained.  The patient was kept under observation until the patient left the procedure room.  The patient left the procedure room in stable condition.   Agitated microbubble saline contrast was not administered.  COMPLICATIONS:    There were no immediate complications.  FINDINGS:  Severely depressed LV function (EF 10-15%). Severely depressed right ventricle. No left atrial thrombus. Normal valves, no ASD.  RECOMMENDATIONS:   Proceed with DCCV.  Time Spent Directly with the Patient:  30 minutes   Devin [name] 01/27/2017, 12:55 PM

## 2017-01-27 NOTE — Progress Notes (Addendum)
Progress Note  Patient Name: Devin [name]. Date of Encounter: 01/27/2017  Primary Cardiologist: New  Subjective   Breathing slightly improved this AM from overnight. Dry cough remains. No CP or palpitations. Slightly anxious about procedure this afternoon, however understands the need.  Inpatient Medications    Scheduled Meds: . aspirin EC  81 mg Oral Daily  . folic acid  1 mg Oral Daily  . pantoprazole  40 mg Oral Q1200  . pneumococcal 23 valent vaccine  0.5 mL Intramuscular Tomorrow-1000  . thiamine  100 mg Oral Daily   Continuous Infusions: . amiodarone 30 mg/hr (01/27/17 0615)  . heparin 1,000 Units/hr (01/26/17 1505)   PRN Meds: acetaminophen **OR** acetaminophen, gi cocktail, guaiFENesin, LORazepam, menthol-cetylpyridinium, morphine injection, ondansetron **OR** ondansetron (ZOFRAN) IV   Vital Signs    Vitals:   01/26/17 2045 01/26/17 2100 01/26/17 2115 01/26/17 2352  BP: 112/89 113/72 94/83   Pulse: 72 (!) 125 (!) 112   Resp: (!) 26 (!) 36 (!) 35   Temp:      TempSrc:      SpO2: (!) 88% 93% (!) 87% 93%  Weight:      Height:        Intake/Output Summary (Last 24 hours) at 01/27/2017 0758 Last data filed at 01/27/2017 0500 Gross per 24 hour  Intake 458.56 ml  Output 1200 ml  Net -741.44 ml   Filed Weights   01/25/17 1631 01/25/17 2259  Weight: 187 lb (84.8 kg) 179 lb 14.3 oz (81.6 kg)    Physical Exam   General: Well developed, well nourished, NAD Skin: Warm, dry, intact  Head: Normocephalic, atraumatic, clear, moist mucus membranes. Neck: Negative for carotid bruits. No JVD Lung: Bilateral anterior friction rales. No wheezes or rhonchi. Breathing is unlabored. Cardiovascular: Irregular with S1 S2. No murmurs, rubs, or gallops. Abdomen: Soft, non-tender, non-distended with normoactive bowel sounds. No obvious abdominal masses. MSK: Strength and tone appear normal for age. 5/5 in all extremities Extremities: No edema. No clubbing or  cyanosis. DP/PT pulses 2+ bilaterally Neuro: Alert and oriented. No focal deficits. No facial asymmetry. MAE spontaneously. Psych: Responds to questions appropriately with normal affect.    Labs    Chemistry Recent Labs  Lab 01/25/17 1650 01/26/17 0212 01/27/17 0446  NA 138 136 138  K 4.4 4.6 4.4  CL 107 108 104  CO2 23 21* 22  GLUCOSE 118* 123* 111*  BUN 11 6 6   CREATININE 1.14 1.08 1.27*  CALCIUM 8.9 9.2 8.6*  PROT 7.0 5.9* 6.5  ALBUMIN 3.8 3.3* 3.6  AST 54* 38 32  ALT 78* 61 52  ALKPHOS 132* 113 114  BILITOT 0.8 1.0 0.8  GFRNONAA >60 >60 >60  GFRAA >60 >60 >60  ANIONGAP 8 7 12      Hematology Recent Labs  Lab 01/25/17 1650 01/26/17 0212 01/27/17 0446  WBC 6.2 8.8 11.6*  RBC 5.47 5.01 5.36  HGB 16.0 15.0 15.7  HCT 46.8 43.8 47.0  MCV 85.6 87.4 87.7  MCH 29.3 29.9 29.3  MCHC 34.2 34.2 33.4  RDW 15.5 15.0 15.1  PLT 243 226 227    Cardiac Enzymes Recent Labs  Lab 01/26/17 0212 01/26/17 1047 01/26/17 2249 01/27/17 0446  TROPONINI 0.06* 0.07* 0.07* 0.06*   No results for input(s): TROPIPOC in the last 168 hours.   BNP Recent Labs  Lab 01/26/17 0212 01/26/17 2249  BNP 345.4* 174.0*     DDimer  Recent Labs  Lab 01/26/17 2249  DDIMER  0.33     Radiology    Dg Chest 2 View  Result Date: 01/25/2017 CLINICAL DATA:  Shortness of breath and cough EXAM: CHEST  2 VIEW COMPARISON:  March 07, 2014 FINDINGS: There is no edema or consolidation. Heart size and pulmonary vascularity are normal. No adenopathy. No pneumothorax. There is thoracic lordosis. IMPRESSION: No edema or consolidation. Electronically Signed   By: Lowella Grip III M.D.   On: 01/25/2017 17:23   Ct Abdomen Pelvis W Contrast  Result Date: 01/26/2017 CLINICAL DATA:  Abdominal distension EXAM: CT ABDOMEN AND PELVIS WITH CONTRAST TECHNIQUE: Multidetector CT imaging of the abdomen and pelvis was performed using the standard protocol following bolus administration of intravenous  contrast. CONTRAST:  198mL ISOVUE-300 IOPAMIDOL (ISOVUE-300) INJECTION 61% COMPARISON:  Abdominal radiographs, 01/25/2017 FINDINGS: Lower chest: Minimal, right greater than left, pleural effusions. Heart borderline enlarged. Hepatobiliary: Liver unremarkable. Gallbladder is mildly distended. Wall appears mildly thickened to 3-4 mm. No stones. No bile duct dilation. Pancreas: Unremarkable. No pancreatic ductal dilatation or surrounding inflammatory changes. Spleen: Normal in size without focal abnormality. Adrenals/Urinary Tract: Adrenal glands are unremarkable. Kidneys are normal, without renal calculi, focal lesion, or hydronephrosis. Bladder is unremarkable. Stomach/Bowel: Stomach and small bowel unremarkable. No colonic dilation, wall thickening or inflammation. No significant increase in stool. Normal appendix visualized. Vascular/Lymphatic: No significant vascular findings are present. No enlarged abdominal or pelvic lymph nodes. Reproductive: Unremarkable. Other: Mild perinephric edema is seen bilaterally. Trace amount of fluid attenuation is seen adjacent to the gallbladder near Morison's pouch. No abdominal wall hernia. Musculoskeletal: No acute or significant osseous findings. IMPRESSION: 1. Gallbladder wall appears borderline thickened. This is nonspecific. If there are symptoms of acute cholecystitis, follow-up right upper quadrant limited ultrasound would be recommended. 2. Trace amount of peritoneal fluid seen adjacent to the gallbladder. Mild perinephric edema is seen bilaterally. Trace, right greater than left pleural effusions. 3. No other abnormalities. No significant bowel distention to account for abdominal distension. Electronically Signed   By: Lajean Manes M.D.   On: 01/26/2017 07:24   Dg Chest Port 1 View  Result Date: 01/26/2017 CLINICAL DATA:  Acute onset of increased respiratory rate and decreased O2 saturation. Atrial flutter. EXAM: PORTABLE CHEST 1 VIEW COMPARISON:  Chest radiograph  performed 01/25/2017 FINDINGS: The lungs are well-aerated. Mild vascular congestion is noted. There is no evidence of focal opacification, pleural effusion or pneumothorax. The cardiomediastinal silhouette is borderline normal in size. No acute osseous abnormalities are seen. IMPRESSION: Mild vascular congestion noted.  Lungs otherwise clear. Electronically Signed   By: Garald Balding M.D.   On: 01/26/2017 23:05   Dg Abd 2 Views  Result Date: 01/25/2017 CLINICAL DATA:  Periumbilical pain and nausea EXAM: ABDOMEN - 2 VIEW COMPARISON:  None. FINDINGS: Supine and upright images were obtained. There is moderate fluid and food material in stomach. There is moderate stool in colon. There is no appreciable bowel dilatation or air-fluid level to suggest bowel obstruction. No free air. There are tiny phleboliths in the pelvis. Lung bases are clear. IMPRESSION: Moderate stool in colon.  No bowel obstruction or free air evident. Electronically Signed   By: Lowella Grip III M.D.   On: 01/25/2017 20:07   US Abdomen Limited Ruq  Result Date: 01/26/2017 CLINICAL DATA:  Abdominal pain since since yesterday. EXAM: ULTRASOUND ABDOMEN LIMITED RIGHT UPPER QUADRANT COMPARISON:  None. FINDINGS: Gallbladder: No gallstones visualized. The gallbladder wall is thickened. No pericholecystic fluid or sonographic Murphy sign noted by sonographer. Common bile duct: Diameter:  0.2 cm. Liver: No focal lesion identified. Within normal limits in parenchymal echogenicity. Portal vein is patent on color Doppler imaging with normal direction of blood flow towards the liver. Other: Right pleural effusion. Trace amount of perihepatic ascites is noted. IMPRESSION: Gallbladder wall thickening without gallstones or evidence of cholecystitis could be due to hypoalbuminemia or small volume of perihepatic ascites. Negative for biliary ductal dilatation. Small right pleural effusion. Electronically Signed   By: Inge Rise M.D.   On:  01/26/2017 12:18    Telemetry    01/27/17- Aflutter 130- Personally Reviewed  ECG    01/25/17-Aflutter with RVR - Personally Reviewed  Cardiac Studies   Echocardiogram  01/26/2017:  LV EF: 20% -   25%  ------------------------------------------------------------------- History:   PMH:  Atrial Flutter.  Risk factors:  ETOH Abuse. Current tobacco use. Hypertension.  ------------------------------------------------------------------- Study Conclusions  - Left ventricle: The cavity size was mildly dilated. Wall   thickness was increased in a pattern of mild LVH. Systolic   function was severely reduced. The estimated ejection fraction   was in the range of 20% to 25%. Diffuse hypokinesis. Doppler   parameters are consistent with restrictive physiology, indicative   of decreased left ventricular diastolic compliance and/or   increased left atrial pressure. Doppler parameters are consistent   with high ventricular filling pressure. - Aortic valve: Valve area (VTI): 2.01 cm^2. Valve area (Vmax):   2.21 cm^2. Valve area (Vmean): 2.09 cm^2. - Mitral valve: Mildly calcified annulus. Mildly thickened leaflets   . There was mild to moderate regurgitation. - Left atrium: The atrium was mildly dilated. - Right ventricle: The cavity size was mildly dilated. Systolic   function was moderately reduced. - Pulmonary arteries: Systolic pressure was moderately increased.   PA peak pressure: 44 mm Hg (S). - Technically adequate study.  Patient Profile     41 y.o. male with a hx of hypertension, HLD and alcohol abuse who is being seen today for the evaluation of atrial flutter at the request of Dr. Thereasa Solo.  Assessment & Plan    1. Aflutter: -Unknown duration. Pt has been symptomatic with signs of orthopnea and DOE for the last two weeks, increasing in severity on 01/25/17. No CP or palpitations noted.  -TSH WNL -Pt started on Diltiazem with slight improvements in rates, however,  overnight 01/27/17, pt became more symptomatic with lower BP. IM changed to Amiodarone gtt. Remains in [place] 120-130's. Less symptomatic. Additional 150mg  bolus given this AM.  -Currently on Heparin gtt -He has been scheduled for a TEE DCCV today, 01/27/17  -This patients CHA2DS2-VASc Score and unadjusted Ischemic Stroke Rate (% per year) is equal to 2.2 % stroke rate/year from a score of 2 Above score calculated as 1 point each if present [CHF, HTN, DM, Vascular=MI/PAD/Aortic Plaque, Age if 65-74, or Male], 2 points each if present [Age > 75, or Stroke/TIA/TE]  2. Systolic Heart Faliure:  -Pt with symptomatic orthopnea most likely r/t Aflutter with RVR. -Current alcohol abuse -EF per Echo 01/26/2017 20-25% -CXR this AM showed mild vascular congestion, 20mg  IV lasix given with good UO per IM -BNP improved 345>>>174  3. Hypertension: -Treated with lisinopril 5 mg daily (has been treated with Coreg as well in the past). Currently on hold. -Normotensive, stable 94/83, 110/72  4. Hyperlidemia:  -LDL 11 on 11/21/2016 -Treated with fenofibrate in the past. D/C this on his own  5. Tobacco abuse: -1/2 PPD smoker -Smoking cessation encouraged  6. Alcohol abuse: -4-5 beers per day -On  CIWA protocol per IM  Signed, Kathyrn Drown, NP  01/27/2017, 7:58 AM    For questions or updates, please contact   Please consult [email or web address] for contact info under Cardiology/STEMI.  Patient examined chart reviewed DCM either from alcohol or rate. Flutter rate harder to control. On heparin Will start eliquis needs anticoagulation for atleast a month post [facility]. Orders written on schedule for TEE/DCC With [facility] today Exam with anxious patient no murmur clear lungs no edema   Jenkins Rouge

## 2017-01-28 ENCOUNTER — Emergency Department (HOSPITAL_BASED_OUTPATIENT_CLINIC_OR_DEPARTMENT_OTHER): Payer: Commercial Managed Care - PPO

## 2017-01-28 ENCOUNTER — Encounter (HOSPITAL_BASED_OUTPATIENT_CLINIC_OR_DEPARTMENT_OTHER): Payer: Self-pay

## 2017-01-28 ENCOUNTER — Other Ambulatory Visit: Payer: Self-pay

## 2017-01-28 ENCOUNTER — Emergency Department (HOSPITAL_BASED_OUTPATIENT_CLINIC_OR_DEPARTMENT_OTHER)
Admission: EM | Admit: 2017-01-28 | Discharge: 2017-01-28 | Disposition: A | Discharge status: Home / Self Care | Payer: Commercial Managed Care - PPO | Attending: Emergency Medicine | Admitting: Emergency Medicine

## 2017-01-28 DIAGNOSIS — R0602 Shortness of breath: Secondary | ICD-10-CM | POA: Diagnosis not present

## 2017-01-28 DIAGNOSIS — I5021 Acute systolic (congestive) heart failure: Secondary | ICD-10-CM | POA: Diagnosis present

## 2017-01-28 DIAGNOSIS — Z5321 Procedure and treatment not carried out due to patient leaving prior to being seen by health care provider: Secondary | ICD-10-CM | POA: Insufficient documentation

## 2017-01-28 HISTORY — DX: Heart failure, unspecified: I50.9

## 2017-01-28 LAB — RENAL FUNCTION PANEL
ALBUMIN: 3.1 g/dL — AB (ref 3.5–5.0)
ANION GAP: 9 (ref 5–15)
BUN: 7 mg/dL (ref 6–20)
CO2: 24 mmol/L (ref 22–32)
Calcium: 8.4 mg/dL — ABNORMAL LOW (ref 8.9–10.3)
Chloride: 103 mmol/L (ref 101–111)
Creatinine, Ser: 1.35 mg/dL — ABNORMAL HIGH (ref 0.61–1.24)
GFR calc Af Amer: >60 mL/min (ref >60)
GFR calc non Af Amer: >60 mL/min (ref >60)
GLUCOSE: 106 mg/dL — AB (ref 65–99)
PHOSPHORUS: 3.5 mg/dL (ref 2.5–4.6)
POTASSIUM: 4 mmol/L (ref 3.5–5.1)
Sodium: 136 mmol/L (ref 135–145)

## 2017-01-28 LAB — CBC
HEMATOCRIT: 44.1 % (ref 39.0–52.0)
HEMOGLOBIN: 14.7 g/dL (ref 13.0–17.0)
MCH: 29.1 pg (ref 26.0–34.0)
MCHC: 33.3 g/dL (ref 30.0–36.0)
MCV: 87.3 fL (ref 78.0–100.0)
Platelets: 219 10*3/uL (ref 150–400)
RBC: 5.05 MIL/uL (ref 4.22–5.81)
RDW: 15 % (ref 11.5–15.5)
WBC: 9.7 10*3/uL (ref 4.0–10.5)

## 2017-01-28 LAB — T3: T3, Total: 77 ng/dL (ref 71–180)

## 2017-01-28 MED ORDER — LISINOPRIL 10 MG PO TABS: 10.0000 mg | ORAL_TABLET | Freq: Every day | ORAL | 0 refills | Status: DC

## 2017-01-28 MED ORDER — APIXABAN 5 MG PO TABS: 5.0000 mg | ORAL_TABLET | Freq: Two times a day (BID) | ORAL | 0 refills | Status: DC

## 2017-01-28 MED ORDER — CARVEDILOL 3.125 MG PO TABS: 3.1250 mg | ORAL_TABLET | Freq: Two times a day (BID) | ORAL | 0 refills | Status: DC

## 2017-01-28 MED ORDER — ACETAMINOPHEN 500 MG PO TABS: 500.0000 mg | ORAL_TABLET | Freq: Four times a day (QID) | ORAL | 0 refills | Status: AC | PRN

## 2017-01-28 NOTE — Discharge Summary (Signed)
DISCHARGE SUMMARY  Devin [name].  MR#: [id number]  DOB:01/16/1976  Date of Admission: 01/25/2017 Date of Discharge: 01/28/2017  Attending Physician:Mitchell Epling T  Patient's VFI:EPPIRJJO, Devin Oyster, DO  Consults: [facility]  Disposition: D/C home   Follow-up Appts: Follow-up Information    [facility] Follow up.   Specialty:  Cardiology Why:  The clinic will contact you to arrand for a follow up appointment.  If you do not hear from the clinic within 3 days, please call them at the number listed here.   Contact information: [address] [phone] mc [place]       Evans Lance, MD Follow up.   Specialty:  Cardiology Why:  The office will contact you to arrange for a follow up visit.   Contact information: [address] [place] [address] [phone]           Tests Needing Follow-up: -follow HR and maintenance of NSR -follow CHF and hopeful LV recovery w/ return of NSR -follow compliance w/ medications -follow compliance w/ abstinence from EtOH -assess renal function w/ use of ACEi in setting of AKI  Discharge Diagnoses: Atrial flutter with RVR  Newly diagnosed Acute systolic CHF Epigastric pain with a history of alcoholism Acute kidney injury Low TSH Alcohol abuse - 4-5 beers/day Tobacco abuse - 1/2 PPD Hypertension  Initial presentation: 41 y.o.malewithhx of hypertension and alcohol abuse who presented to the ER with epigastric pain.   In the ER the patient was found to be tachycardic and EKG showed atrial flutter with RVR.  Cardiology was consulted and the patient was started on heparin and Cardizem infusion.   During the course of the hospital evaluation, it was discovered that the pt was suffering w/ severe acute systolic CHF, w/ an EF of 20%.    Hospital Course:  Atrial flutter with RVR  Rate proved very  difficult to control - CHA2DS2 VASc score 2 given newly appreciated CHF - heparin gtt > eliquis - amio stopped by Cards after DCCV - TSH is low (see below) - Cards following - TEE DCCV successful on 12/10 w/ pt still in NSR at time of d/c    Newly diagnosed Acute systolic CHF EF per TEE 62-37% - rate related v/s EtOH CM - counseled pt on need for tx to maximize potential to recover - absolute need to avoid EtOH stressed to patient - ACEi resumed prior to d/c   Epigastric pain with a history of alcoholism Lipase normal - CT not striking - RUQ [place] noted only GB wall thickening but no gallstones or evidence of cholecystitis - suspect his sx were due to EtOH gastritis - sx resolved at time of d/c   Acute kidney injury crt climbed during hospital stay in setting of hypotension related to RVR and CHF -ACEi resumed by Cards prior to d/c - pt advised to avoid NSAIDs  Low TSH TSH low this admit - also low 12/06/16 as outpt, but free T4 at that time was normal at 1.2 - free T4 this admit also normal at 0.97 - T3 actually low-normal at 77  Alcohol abuse - 4-5 beers/day patient advised to quit drinking - no evidence of withdrawal - counseled on possible link to his heart disease   Tobacco abuse - 1/2 PPD advised to quit smoking  Hypertension BP controlled at time of d/c    Allergies as of 01/28/2017   No Known Allergies  Medication List    TAKE these medications   acetaminophen 500 MG tablet Commonly known as:  TYLENOL Take 1 tablet (500 mg total) by mouth every 6 (six) hours as needed for mild pain (or Fever >/= 101).   apixaban 5 MG Tabs tablet Commonly known as:  ELIQUIS Take 1 tablet (5 mg total) by mouth 2 (two) times daily.   bismuth subsalicylate 865 HQ/46NG suspension Commonly known as:  PEPTO BISMOL Take 30 mLs by mouth every 6 (six) hours as needed for indigestion.   calcium carbonate 500 MG chewable tablet Commonly known as:  TUMS - dosed in mg elemental  calcium Chew 1 tablet by mouth daily as needed for indigestion or heartburn.   carvedilol 3.125 MG tablet Commonly known as:  COREG Take 1 tablet (3.125 mg total) by mouth 2 (two) times daily with a meal.   lisinopril 5 MG tablet Commonly known as:  PRINIVIL,ZESTRIL Take 1 tablet (5 mg total) by mouth daily.       Day of Discharge BP 106/75   Pulse 97   Temp 97.7 F (36.5 C) (Oral)   Resp (!) 31   Ht 6' (1.829 m)   Wt 82 kg (180 lb 12.8 oz)   SpO2 95%   BMI 24.52 kg/m   Physical Exam: General: No acute respiratory distress Lungs: Clear to auscultation bilaterally without wheezes or crackles Cardiovascular: Regular rate and rhythm without murmur gallop or rub normal S1 and S2 Abdomen: Nontender, nondistended, soft, bowel sounds positive, no rebound, no ascites, no appreciable mass Extremities: No significant cyanosis, clubbing, or edema bilateral lower extremities  Basic Metabolic Panel: Recent Labs  Lab 01/25/17 1650 01/26/17 0212 01/27/17 0446 01/28/17 0229  NA 138 136 138 136  K 4.4 4.6 4.4 4.0  CL 107 108 104 103  CO2 23 21* 22 24  GLUCOSE 118* 123* 111* 106*  BUN 11 6 6 7   CREATININE 1.14 1.08 1.27* 1.35*  CALCIUM 8.9 9.2 8.6* 8.4*  MG 2.2  --   --   --   PHOS 3.5  --   --  3.5    Liver Function Tests: Recent Labs  Lab 01/25/17 1650 01/26/17 0212 01/27/17 0446 01/28/17 0229  AST 54* 38 32  --   ALT 78* 61 52  --   ALKPHOS 132* 113 114  --   BILITOT 0.8 1.0 0.8  --   PROT 7.0 5.9* 6.5  --   ALBUMIN 3.8 3.3* 3.6 3.1*   Recent Labs  Lab 01/25/17 1650 01/26/17 0212  LIPASE 33 32    CBC: Recent Labs  Lab 01/25/17 1650 01/26/17 0212 01/27/17 0446 01/28/17 0224  WBC 6.2 8.8 11.6* 9.7  NEUTROABS 4.4  --   --   --   HGB 16.0 15.0 15.7 14.7  HCT 46.8 43.8 47.0 44.1  MCV 85.6 87.4 87.7 87.3  PLT 243 226 227 219    Cardiac Enzymes: Recent Labs  Lab 01/26/17 0212 01/26/17 1047 01/26/17 2249 01/27/17 0446 01/27/17 0942  TROPONINI  0.06* 0.07* 0.07* 0.06* 0.06*   BNP (last 3 results) Recent Labs    01/26/17 0212 01/26/17 2249  BNP 345.4* 174.0*     Recent Results (from the past 240 hour(s))  MRSA PCR Screening     Status: None   Collection Time: 01/25/17 10:43 PM  Result Value Ref Range Status   MRSA by PCR NEGATIVE NEGATIVE Final    Comment:        The GeneXpert MRSA  Assay (FDA approved for NASAL specimens only), is one component of a comprehensive MRSA colonization surveillance program. It is not intended to diagnose MRSA infection nor to guide or monitor treatment for MRSA infections.    Time spent in discharge (includes decision making & examination of pt): 35 minutes  01/28/2017, 10:25 AM   Cherene Altes, MD [facility] Office  [phone] Pager [phone]  On-Call/Text Page:      [email or web address]      password [facility]

## 2017-01-28 NOTE — Progress Notes (Signed)
   28 January 2017  To Whom it may concern,  The spouse of Devin [name] was admitted to [facility] on 01/25/17 and remained under my care in the hospital through 01/28/2017.  Her spouse has been advised that he should not return to work until 02/03/17.  Due to the serious nature of her spouse's illness, Devin [name] has been present in the hospital and involved in his care throughout the entire admission.  Furthermore, she has been advised by the medical team to remain at home with her spouse until he is cleared to return to work, so that she may assist in his recovery from this serious medical condition.    Sincerely,     Cherene Altes, MD [facility] Office  [phone]

## 2017-01-28 NOTE — ED Notes (Signed)
Not in ED WR

## 2017-01-28 NOTE — Care Management Note (Addendum)
Case Management Note  Patient Details  Name: Devin [name]. MRN: [id number] Date of Birth: November 08, 1975  Subjective/Objective:    Pt admitted with A Flutter - pt is s/p cardioversion                Action/Plan:   Pt is independent from home with significant other.  Pt has PCP and active insurance coverage.  Pt confirmed he does not have medicare nor medicaid - CM provided both Eliquis free 30 day card and reduced copay card.  CM contacted pharmacy of choice [facility] on [address] in [place] verified they can fill prescription today.  CM submitted benefit check to determine if prior Josem Kaufmann is requiried  Expected Discharge Date:  01/27/17               Expected Discharge Plan:  Home/Self Care  In-House Referral:     Discharge planning Services  CM Consult, Medication Assistance  Post Acute Care Choice:    Choice offered to:     DME Arranged:    DME Agency:     HH Arranged:    [place] Agency:     Status of Service:  Completed, signed off  If discussed at [employer], dates discussed:    Additional Comments: Update Pt decided to leave prior to benefit check result to determine if prior auth is necessary.  Pt instructed to follow up with cardiologist and or pharmacy  to determine if prior auth is neccessary Maryclare Labrador, RN 01/28/2017, 10:06 AM

## 2017-01-28 NOTE — Progress Notes (Signed)
Pt. Received all discharge paperwork. Letters for pt and spouse were received. All belonging were with pt. And taken home with him.

## 2017-01-28 NOTE — Discharge Instructions (Signed)
Atrial Flutter °Atrial flutter is a type of abnormal heart rhythm (arrhythmia). In atrial flutter, the heartbeat is fast but regular. There are two types of atrial flutter: °· Paroxysmal atrial flutter. This type starts suddenly. It usually stops on its own soon after it starts. °· Permanent atrial flutter. This type does not go away. ° °What are the causes? °This condition may be caused by: °· A heart condition or problem, such as: °? A heart attack. °? Heart failure. °? A heart valve problem. °· A lung problem, such as: °? A blood clot in the lungs (pulmonary embolism, or PE). °? Chronic obstructive pulmonary disease. °· Poorly controlled high blood pressure (hypertension). °· Hyperthyroidism. °· Caffeine. °· Some decongestant cold medicines. °· Low levels of minerals called electrolytes in the blood. °· Cocaine. ° °What increases the risk? °This condition is more likely to develop in: °· Elderly adults. °· Men. ° °What are the signs or symptoms? °Symptoms of this condition include: °· A feeling that your heart is pounding or racing (palpitations). °· Shortness of breath. °· Chest pain. °· Feeling light-headed. °· Dizziness. °· Fainting. ° °How is this diagnosed? °This condition may be diagnosed with tests, including: °· An electrocardiogram (ECG). This is a painless test that records electrical signals in the heart. °· Holter monitoring. For this test, you wear a device that records your heartbeat for 1-2 days. °· Cardiac event monitoring. For this test, you wear a device that records your heartbeat for up to 30 days. °· An echocardiogram. This is a painless test that uses sound waves to make a picture of your heart. °· Stress test. This test records your heartbeat while you exercise. °· Blood tests. ° °How is this treated? °This condition may be treated with: °· Treatment of any underlying conditions. °· Medicine to make your heart beat more slowly. °· Medicine to keep the condition from coming back. °· A  procedure to keep the condition under control. Some procedures to do this include: °? Cardioversion. During this procedure, medicines or an electrical shock are given to make the heart beat normally. °? Ablation. During this procedure, the heart tissue that is causing the problem is destroyed. This procedure may be done if atrial flutter lasts a long time or happens often. ° °Follow these instructions at home: °· Take over-the-counter and prescription medicines only as told by your health care provider. °· Do not take any new medicines without talking to your health care provider. °· Do not use tobacco products, including cigarettes, chewing tobacco, or e-cigarettes. If you need help quitting, ask your health care provider. °· Limit alcohol intake to no more than 1 drink per day for nonpregnant women and 2 drinks per day for men. One drink equals 12 oz of beer, 5 oz of wine, or 1½ oz of hard liquor. °· Try to reduce any stress. Stress can make your symptoms worse. °Contact a health care provider if: °· Your symptoms get worse. °Get help right away if: °· You are dizzy. °· You feel like fainting or you faint. °· You have shortness of breath. °· You feel pain or pressure in your chest. °· You suddenly feel nauseous or you suddenly vomit. °· There is a sudden change in your ability to speak, eat, or move. °· You are sweating a lot for no reason. °This information is not intended to replace advice given to you by your health care provider. Make sure you discuss any questions you have with your health care   provider. Document Released: 06/23/2008 Document Revised: 06/14/2015 Document Reviewed: 08/19/2014 Elsevier Interactive Patient Education  2018 [employer].   Heart Failure Heart failure is a condition in which the heart has trouble pumping blood because it has become weak or stiff. This means that the heart does not pump blood efficiently for the body to work well. For some people with heart failure, fluid may  back up into the lungs and there may be swelling (edema) in the lower legs. Heart failure is usually a long-term (chronic) condition. It is important for you to take good care of yourself and follow the treatment plan from your health care provider. What are the causes? This condition is caused by some health problems, including:  High blood pressure (hypertension). Hypertension causes the heart muscle to work harder than normal. High blood pressure eventually causes the heart to become stiff and weak.  Coronary artery disease (CAD). CAD is the buildup of cholesterol and fat (plaques) in the arteries of the heart.  Heart attack (myocardial infarction). Injured tissue, which is caused by the heart attack, does not contract as well and the heart's ability to pump blood is weakened.  Abnormal heart valves. When the heart valves do not open and close properly, the heart muscle must pump harder to keep the blood flowing.  Heart muscle disease (cardiomyopathy or myocarditis). Heart muscle disease is damage to the heart muscle from a variety of causes, such as drug or alcohol abuse, infections, or unknown causes. These can increase the risk of heart failure.  Lung disease. When the lungs do not work properly, the heart must work harder.  What increases the risk? Risk of heart failure increases as a person ages. This condition is also more likely to develop in people who:  Are overweight.  Are male.  Smoke or chew tobacco.  Abuse alcohol or illegal drugs.  Have taken medicines that can damage the heart, such as chemotherapy drugs.  Have diabetes. ? High blood sugar (glucose) is associated with high fat (lipid) levels in the blood. ? Diabetes can also damage tiny blood vessels that carry nutrients to the heart muscle.  Have abnormal heart rhythms.  Have thyroid problems.  Have low blood counts (anemia).  What are the signs or symptoms? Symptoms of this condition include:  Shortness  of breath with activity, such as when climbing stairs.  Persistent cough.  Swelling of the feet, ankles, legs, or abdomen.  Unexplained weight gain.  Difficulty breathing when lying flat (orthopnea).  Waking from sleep because of the need to sit up and get more air.  Rapid heartbeat.  Fatigue and loss of energy.  Feeling light-headed, dizzy, or close to fainting.  Loss of appetite.  Nausea.  Increased urination during the night (nocturia).  Confusion.  How is this diagnosed? This condition is diagnosed based on:  Medical history, symptoms, and a physical exam.  Diagnostic tests, which may include: ? Echocardiogram. ? Electrocardiogram (ECG). ? Chest X-ray. ? Blood tests. ? Exercise stress test. ? Radionuclide scans. ? Cardiac catheterization and angiogram.  How is this treated? Treatment for this condition is aimed at managing the symptoms of heart failure. Medicines, behavioral changes, or other treatments may be necessary to treat heart failure. Medicines These may include:  Angiotensin-converting enzyme (ACE) inhibitors. This type of medicine blocks the effects of a blood protein called angiotensin-converting enzyme. ACE inhibitors relax (dilate) the blood vessels and help to lower blood pressure.  Angiotensin receptor blockers (ARBs). This type of medicine blocks  the actions of a blood protein called angiotensin. ARBs dilate the blood vessels and help to lower blood pressure.  Water pills (diuretics). Diuretics cause the kidneys to remove salt and water from the blood. The extra fluid is removed through urination, leaving a lower volume of blood that the heart has to pump.  Beta blockers. These improve heart muscle strength and they prevent the heart from beating too quickly.  Digoxin. This increases the force of the heartbeat.  Healthy behavior changes These may include:  Reaching and maintaining a healthy weight.  Stopping smoking or chewing  tobacco.  Eating heart-healthy foods.  Limiting or avoiding alcohol.  Stopping use of street drugs (illegal drugs).  Physical activity.  Other treatments These may include:  Surgery to open blocked coronary arteries or repair damaged heart valves.  Placement of a biventricular pacemaker to improve heart muscle function (cardiac resynchronization therapy). This device paces both the right ventricle and left ventricle.  Placement of a device to treat serious abnormal heart rhythms (implantable cardioverter defibrillator, or ICD).  Placement of a device to improve the pumping ability of the heart (left ventricular assist device, or LVAD).  Heart transplant. This can cure heart failure, and it is considered for certain patients who do not improve with other therapies.  Follow these instructions at home: Medicines  Take over-the-counter and prescription medicines only as told by your health care provider. Medicines are important in reducing the workload of your heart, slowing the progression of heart failure, and improving your symptoms. ? Do not stop taking your medicine unless your health care provider told you to do that. ? Do not skip any dose of medicine. ? Refill your prescriptions before you run out of medicine. You need your medicines every day. Eating and drinking   Eat heart-healthy foods. Talk with a dietitian to make an eating plan that is right for you. ? Choose foods that contain no trans fat and are low in saturated fat and cholesterol. Healthy choices include fresh or frozen fruits and vegetables, fish, lean meats, legumes, fat-free or low-fat dairy products, and whole-grain or high-fiber foods. ? Limit salt (sodium) if directed by your health care provider. Sodium restriction may reduce symptoms of heart failure. Ask a dietitian to recommend heart-healthy seasonings. ? Use healthy cooking methods instead of frying. Healthy methods include roasting, grilling, broiling,  baking, poaching, steaming, and stir-frying.  Limit your fluid intake if directed by your health care provider. Fluid restriction may reduce symptoms of heart failure. Lifestyle  Stop smoking or using chewing tobacco. Nicotine and tobacco can damage your heart and your blood vessels. Do not use nicotine gum or patches before talking to your health care provider.  Limit alcohol intake to no more than 1 drink per day for non-pregnant women and 2 drinks per day for men. One drink equals 12 oz of beer, 5 oz of wine, or 1 oz of hard liquor. ? Drinking more than that is harmful to your heart. Tell your health care provider if you drink alcohol several times a week. ? Talk with your health care provider about whether any level of alcohol use is safe for you. ? If your heart has already been damaged by alcohol or you have severe heart failure, drinking alcohol should be stopped completely.  Stop use of illegal drugs.  Lose weight if directed by your health care provider. Weight loss may reduce symptoms of heart failure.  Do moderate physical activity if directed by your health care provider.  People who are elderly and people with severe heart failure should consult with a health care provider for physical activity recommendations. Monitor important information  Weigh yourself every day. Keeping track of your weight daily helps you to notice excess fluid sooner. ? Weigh yourself every morning after you urinate and before you eat breakfast. ? Wear the same amount of clothing each time you weigh yourself. ? Record your daily weight. Provide your health care provider with your weight record.  Monitor and record your blood pressure as told by your health care provider.  Check your pulse as told by your health care provider. Dealing with extreme temperatures  If the weather is extremely hot: ? Avoid vigorous physical activity. ? Use air conditioning or fans or seek a cooler location. ? Avoid  caffeine and alcohol. ? Wear loose-fitting, lightweight, and light-colored clothing.  If the weather is extremely cold: ? Avoid vigorous physical activity. ? Layer your clothes. ? Wear mittens or gloves, a hat, and a scarf when you go outside. ? Avoid alcohol. General instructions  Manage other health conditions such as hypertension, diabetes, thyroid disease, or abnormal heart rhythms as told by your health care provider.  Learn to manage stress. If you need help to do this, ask your health care provider.  Plan rest periods when fatigued.  Get ongoing education and support as needed.  Participate in or seek rehabilitation as needed to maintain or improve independence and quality of life.  Stay up to date with immunizations. Keeping current on pneumococcal and influenza immunizations is especially important to prevent respiratory infections.  Keep all follow-up visits as told by your health care provider. This is important. Contact a health care provider if:  You have a rapid weight gain.  You have increasing shortness of breath that is unusual for you.  You are unable to participate in your usual physical activities.  You tire easily.  You cough more than normal, especially with physical activity.  You have any swelling or more swelling in areas such as your hands, feet, ankles, or abdomen.  You are unable to sleep because it is hard to breathe.  You feel like your heart is beating quickly (palpitations).  You become dizzy or light-headed when you stand up. Get help right away if:  You have difficulty breathing.  You notice or your family notices a change in your awareness, such as having trouble staying awake or having difficulty with concentration.  You have pain or discomfort in your chest.  You have an episode of fainting (syncope). This information is not intended to replace advice given to you by your health care provider. Make sure you discuss any questions  you have with your health care provider. Document Released: 02/04/2005 Document Revised: 10/10/2015 Document Reviewed: 08/30/2015 Elsevier Interactive Patient Education  2017 [employer].

## 2017-01-28 NOTE — Progress Notes (Deleted)
Asked pt. If he was ready to do an In and Out. Pt. Refused and told me to get out of the room.

## 2017-01-28 NOTE — ED Notes (Signed)
No answer x1

## 2017-01-28 NOTE — ED Triage Notes (Addendum)
C/o SOB since d/c from [facility] today approx 1pm-NAD-steady gait-pt added it may be r/t to anxiety-states he was getting ativan IV in hosp for anxiety but was not given rx ativan-asked if he could get rx today-was advised that would be at the discretion of EDP

## 2017-01-28 NOTE — Progress Notes (Signed)
Progress Note  Patient Name: Devin [name]. Date of Encounter: 01/28/2017  Primary Cardiologist: [facility]/ CHF clinic   Subjective   Doing well. No issues overnight. Pt is anxious to go home today. Remains in NSR. No SOB.   Inpatient Medications    Scheduled Meds: . apixaban  5 mg Oral BID  . aspirin EC  81 mg Oral Daily  . carvedilol  3.125 mg Oral BID WC  . folic acid  1 mg Oral Daily  . lisinopril  10 mg Oral Daily  . pantoprazole  40 mg Oral Q1200  . pneumococcal 23 valent vaccine  0.5 mL Intramuscular Tomorrow-1000  . thiamine  100 mg Oral Daily   Continuous Infusions: . amiodarone Stopped (01/27/17 1305)   PRN Meds: acetaminophen **OR** acetaminophen, bismuth subsalicylate, gi cocktail, guaiFENesin, menthol-cetylpyridinium, morphine injection, ondansetron **OR** ondansetron (ZOFRAN) IV   Vital Signs    Vitals:   01/27/17 2300 01/28/17 0039 01/28/17 0040 01/28/17 0540  BP: 91/68 100/75 100/75 110/69  Pulse: 94  93 96  Resp: (!) 39  (!) 37 (!) 31  Temp:    97.7 F (36.5 C)  TempSrc:  Oral  Oral  SpO2: 94%  99% 95%  Weight:    180 lb 12.8 oz (82 kg)  Height:        Intake/Output Summary (Last 24 hours) at 01/28/2017 0750 Last data filed at 01/28/2017 0443 Gross per 24 hour  Intake 200 ml  Output 300 ml  Net -100 ml   Filed Weights   01/25/17 1631 01/25/17 2259 01/28/17 0540  Weight: 187 lb (84.8 kg) 179 lb 14.3 oz (81.6 kg) 180 lb 12.8 oz (82 kg)    Physical Exam   General: Well developed, well nourished, NAD Skin: Warm, dry, intact  Head: Normocephalic, atraumatic, clear, moist mucus membranes. Neck: Negative for carotid bruits. No JVD Lungs:Clear to ausculation bilaterally. No wheezes, rales, or rhonchi. Breathing is increased, however unlabored. Cardiovascular: RRR with S1 S2. No murmurs, rubs, gallops, or LV heave appreciated. Abdomen: Soft, non-tender, non-distended with normoactive bowel sounds. No obvious abdominal masses. MSK:  Strength and tone appear normal for age. 5/5 in all extremities Extremities: No edema. No clubbing or cyanosis. DP/PT pulses 2+ bilaterally Neuro: Alert and oriented. No focal deficits. No facial asymmetry. MAE spontaneously. Psych: Responds to questions appropriately with normal affect.    Labs    Chemistry Recent Labs  Lab 01/25/17 1650 01/26/17 0212 01/27/17 0446 01/28/17 0229  NA 138 136 138 136  K 4.4 4.6 4.4 4.0  CL 107 108 104 103  CO2 23 21* 22 24  GLUCOSE 118* 123* 111* 106*  BUN 11 6 6 7   CREATININE 1.14 1.08 1.27* 1.35*  CALCIUM 8.9 9.2 8.6* 8.4*  PROT 7.0 5.9* 6.5  --   ALBUMIN 3.8 3.3* 3.6 3.1*  AST 54* 38 32  --   ALT 78* 61 52  --   ALKPHOS 132* 113 114  --   BILITOT 0.8 1.0 0.8  --   GFRNONAA >60 >60 >60 >60  GFRAA >60 >60 >60 >60  ANIONGAP 8 7 12 9      Hematology Recent Labs  Lab 01/26/17 0212 01/27/17 0446 01/28/17 0224  WBC 8.8 11.6* 9.7  RBC 5.01 5.36 5.05  HGB 15.0 15.7 14.7  HCT 43.8 47.0 44.1  MCV 87.4 87.7 87.3  MCH 29.9 29.3 29.1  MCHC 34.2 33.4 33.3  RDW 15.0 15.1 15.0  PLT 226 227 219  Cardiac Enzymes Recent Labs  Lab 01/26/17 1047 01/26/17 2249 01/27/17 0446 01/27/17 0942  TROPONINI 0.07* 0.07* 0.06* 0.06*   No results for input(s): TROPIPOC in the last 168 hours.   BNP Recent Labs  Lab 01/26/17 0212 01/26/17 2249  BNP 345.4* 174.0*     DDimer  Recent Labs  Lab 01/26/17 2249  DDIMER 0.33     Radiology    Ct Chest Wo Contrast  Result Date: 01/27/2017 CLINICAL DATA:  Atrial fibrillation.  Shortness of breath.  Hypoxia. EXAM: CT CHEST WITHOUT CONTRAST TECHNIQUE: Multidetector CT imaging of the chest was performed following the standard protocol without IV contrast. COMPARISON:  01/26/2017 FINDINGS: Cardiovascular: Mild cardiomegaly. Trace pericardial effusion. Today' s exam does not assess for pulmonary embolus given the lack of IV contrast. Mediastinum/Nodes: Right lower paratracheal node anterior to the  carina 1.0 cm in short axis on image 57/3, with somewhat fatty hilum. Right hilar lymph node 1.0 cm in short axis on image 45/6. Bilateral small infrahilar lymph nodes are present. Low-level edema in the mediastinal adipose tissues. Lungs/Pleura: Bilateral patchy primarily central airspace opacities conforming to groupings of secondary pulmonary lobules and in association with considerable bilateral airway thickening and small to moderate pleural effusions with which are nevertheless increased compared to 01/26/2017. New ground-glass opacities and secondary pulmonary lobular interstitial accentuation at the lung bases. Upper Abdomen: Unremarkable Musculoskeletal: Unremarkable IMPRESSION: 1. Overall pattern most compatible with acute pulmonary edema, with bilateral primarily central airspace opacities, small but enlarging bilateral pleural effusions, mild cardiomegaly, a new small pericardial effusion, mild nodal congestion, and low-level edema in the mediastinum. Infectious causes for findings on today' s exam are considered most likely particularly in light of yesterday's echocardiogram results which indicate significant dysfunction with left ventricular ejection fraction in the 20-25% range. Electronically Signed   By: Van Clines M.D.   On: 01/27/2017 08:21   Dg Chest Port 1 View  Result Date: 01/26/2017 CLINICAL DATA:  Acute onset of increased respiratory rate and decreased O2 saturation. Atrial flutter. EXAM: PORTABLE CHEST 1 VIEW COMPARISON:  Chest radiograph performed 01/25/2017 FINDINGS: The lungs are well-aerated. Mild vascular congestion is noted. There is no evidence of focal opacification, pleural effusion or pneumothorax. The cardiomediastinal silhouette is borderline normal in size. No acute osseous abnormalities are seen. IMPRESSION: Mild vascular congestion noted.  Lungs otherwise clear. Electronically Signed   By: Garald Balding M.D.   On: 01/26/2017 23:05   US Abdomen Limited  Ruq  Result Date: 01/26/2017 CLINICAL DATA:  Abdominal pain since since yesterday. EXAM: ULTRASOUND ABDOMEN LIMITED RIGHT UPPER QUADRANT COMPARISON:  None. FINDINGS: Gallbladder: No gallstones visualized. The gallbladder wall is thickened. No pericholecystic fluid or sonographic Murphy sign noted by sonographer. Common bile duct: Diameter:  0.2 cm. Liver: No focal lesion identified. Within normal limits in parenchymal echogenicity. Portal vein is patent on color Doppler imaging with normal direction of blood flow towards the liver. Other: Right pleural effusion. Trace amount of perihepatic ascites is noted. IMPRESSION: Gallbladder wall thickening without gallstones or evidence of cholecystitis could be due to hypoalbuminemia or small volume of perihepatic ascites. Negative for biliary ductal dilatation. Small right pleural effusion. Electronically Signed   By: Inge Rise M.D.   On: 01/26/2017 12:18    Telemetry    01/28/17- ST- 95 - Personally Reviewed  ECG    01/27/2017-ST-102 - Personally Reviewed  Cardiac Studies   TEE DCCV 01/27/2017:  LV EF: 10% -   15%  ------------------------------------------------------------------- Indications:  Atrial flutter 427.32.  ------------------------------------------------------------------- History:   PMH:   Atrial flutter.  Atrial flutter.  Risk factors: Hypertension. Dyslipidemia.  ------------------------------------------------------------------- Study Conclusions  - Left ventricle: The cavity size was moderately dilated. Wall   thickness was normal. The estimated ejection fraction was in the   range of 10% to 15%. Severe diffuse hypokinesis with no   identifiable regional variations. No evidence of thrombus. No   evidence of thrombus. - Aortic valve: There was trivial regurgitation. - Mitral valve: No evidence of vegetation. There was mild to   moderate regurgitation directed centrally. - Left atrium: The atrium was  mildly dilated. No evidence of   thrombus in the atrial cavity or appendage. No evidence of   thrombus in the atrial cavity or appendage. There was   mildcontinuous spontaneous echo contrast (&quot;smoke&quot;) in the   appendage. Emptying velocity was reduced. - Right ventricle: Systolic function was moderately to severely   reduced. - Right atrium: No evidence of thrombus in the atrial cavity or   appendage. - Atrial septum: No defect or patent foramen ovale was identified. - Tricuspid valve: There was mild-moderate regurgitation directed   centrally. - Pulmonic valve: No evidence of vegetation. - Pulmonary arteries: Systolic pressure was mildly increased. PA   peak pressure: 46 mm Hg (S).  Impressions:  - Successful cardioversion. No cardiac source of emboli was   indentified.  Echo 01/26/2017:  LV EF: 20% -   25%  ------------------------------------------------------------------- History:   PMH:  Atrial Flutter.  Risk factors:  ETOH Abuse. Current tobacco use. Hypertension.  ------------------------------------------------------------------- Study Conclusions  - Left ventricle: The cavity size was mildly dilated. Wall   thickness was increased in a pattern of mild LVH. Systolic   function was severely reduced. The estimated ejection fraction   was in the range of 20% to 25%. Diffuse hypokinesis. Doppler   parameters are consistent with restrictive physiology, indicative   of decreased left ventricular diastolic compliance and/or   increased left atrial pressure. Doppler parameters are consistent   with high ventricular filling pressure. - Aortic valve: Valve area (VTI): 2.01 cm^2. Valve area (Vmax):   2.21 cm^2. Valve area (Vmean): 2.09 cm^2. - Mitral valve: Mildly calcified annulus. Mildly thickened leaflets   . There was mild to moderate regurgitation. - Left atrium: The atrium was mildly dilated. - Right ventricle: The cavity size was mildly dilated.  Systolic   function was moderately reduced. - Pulmonary arteries: Systolic pressure was moderately increased.   PA peak pressure: 44 mm Hg (S). - Technically adequate study.  Patient Profile     41 y.o. male with a hx of hypertension, HLD and alcohol abusewho is being seen today for the evaluation of atrial flutterat the request of Dr. Thereasa Solo.  Assessment & Plan    1. Aflutter converted to SR/ST:  -TEE w/ DCCV 01/27/2017, Aflutter>>NSR/ST with one shock -Pt placed on Eliquis post procedure.  -ST with rates in the 90-100's. Hesitant to increase carvedilol too quickly given his low EF and normotensive BP  -Will need to remain anticoagulated for at least one month. -IV amiodarone stopped 01/27/2017 after successful cardioversion.   2. Acute systolic heart failure: -EFon TEE/DCCV 01/27/17 10-15%.  -Pt placed on BB, ACE-I, and ASA -Will decrease lisinopril from 10mg  to 5mg  given worsening renal function.  -Cr 01/28/17- 1.35, up from 1.27 on 01/27/17 -Will need to monitor closely   3. 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 Rapid Aflutter -Maximize medications -Recheck Echo in 3 months -May  need ICD  4. HTN: -Controlled on BB, ACE-I -110/69 today -Continue current therapy  5. HLD: -LDL 11 on 11/21/2016 -Treated with fenofibrate in the past. D/C this on his own  6. Tobacco abuse: -1/2 PPD -Smoking cessation encouraged  7. Alcohol abuse: -4-5 beers per day  -CIWA protocol -No s/s of withdrawal  8. AKI: -Cr increasing slowly -Will continue ACE-I for now given his systolic heart failure -Will need BMET at follow up appointment to monitor progression  Signed, Kathyrn Drown, NP  01/28/2017, 7:50 AM    For questions or updates, please contact   Please consult [email or web address] for contact info under Cardiology/STEMI.  Volume ok titrating meds for CHF will be a challenge with compliance BP and renal function Started low dose ACE and coreg . Maintaining NSR  must stop drinking would have him f/u in CHF clinic and have transitional care visit with PA  Jenkins Rouge

## 2017-01-28 NOTE — ED Notes (Signed)
Not in ED WR when called for tx area

## 2017-01-28 NOTE — Progress Notes (Signed)
   28 January 2017  To Whom it may concern,  Devin [name] was admitted to [facility] on 01/25/17 and remained under my care in the hospital through 01/28/17.  He has been advised that he should not return to work until 02/03/17.  He will be seen for a follow up visit by his doctor after his discharge from the hospital, at which time his advised time away from work could potentially be extended further.    Sincerely,    Cherene Altes, MD [facility] Office  [phone]

## 2017-01-29 ENCOUNTER — Ambulatory Visit (INDEPENDENT_AMBULATORY_CARE_PROVIDER_SITE_OTHER): Payer: Commercial Managed Care - PPO | Admitting: Family Medicine

## 2017-01-29 ENCOUNTER — Encounter: Payer: Self-pay | Admitting: Family Medicine

## 2017-01-29 ENCOUNTER — Telehealth: Payer: Self-pay

## 2017-01-29 VITALS — BP 124/68 | HR 92 | Temp 97.5°F | Ht 72.0 in | Wt 184.1 lb

## 2017-01-29 DIAGNOSIS — F419 Anxiety disorder, unspecified: Secondary | ICD-10-CM

## 2017-01-29 DIAGNOSIS — Z87891 Personal history of nicotine dependence: Secondary | ICD-10-CM

## 2017-01-29 MED ORDER — HYDROXYZINE HCL 50 MG PO TABS
50.0000 mg | ORAL_TABLET | Freq: Three times a day (TID) | ORAL | 1 refills | Status: DC | PRN
Start: 2017-01-29 — End: 2020-08-25

## 2017-01-29 NOTE — Progress Notes (Signed)
Chief Complaint  Patient presents with  . Cough  . Shortness of Breath  . Anxiety    Subjective Devin [name]. is an 41 y.o. male who presents with anxiety. Here with wife, Nira Conn. Symptoms began around 6-7 mo ago. Anxiety symptoms: insomnia, palpitations, racing thoughts, shortness of breath, panic attacks. Family history significant for mom and sister. Social stressors include work and recent health issue He is currently being treated with nothing; got Ativan that helped in hospital He is not following with a psychologist. Also having a cough, quit smoking and drink (4-5 beers/d) 4 days ago.  Past Medical History:  Diagnosis Date  . CHF (congestive heart failure) ([place])   . Hyperlipidemia   . Hypertension     Medications Current Outpatient Medications on File Prior to Visit  Medication Sig Dispense Refill  . acetaminophen (TYLENOL) 500 MG tablet Take 1 tablet (500 mg total) by mouth every 6 (six) hours as needed for mild pain (or Fever >/= 101). 1 tablet 0  . apixaban (ELIQUIS) 5 MG TABS tablet Take 1 tablet (5 mg total) by mouth 2 (two) times daily. 60 tablet 0  . carvedilol (COREG) 3.125 MG tablet Take 1 tablet (3.125 mg total) by mouth 2 (two) times daily with a meal. 60 tablet 0  . lisinopril (PRINIVIL,ZESTRIL) 5 MG tablet Take 1 tablet (5 mg total) by mouth daily. 90 tablet 3   Allergies No Known Allergies  Family History Family History  Problem Relation Age of Onset  . Diabetes Mother   . Hypertension Mother   . Hypertension Father   . Hypertension Sister   . Asthma Son   . Cancer Maternal Grandfather   . Cancer Paternal Grandmother        cancer  . Heart attack Paternal Grandfather      Review Of Systems Cardiovascular:  no chest pain, no current palpitations Psychiatric: as noted in HPI  Exam BP 124/68 (BP Location: Left Arm, Patient Position: Sitting, Cuff Size: Large)   Pulse 92   Temp (!) 97.5 F (36.4 C) (Oral)   Ht 6' (1.829 m)   Wt 184 lb 2  oz (83.5 kg)   SpO2 98%   BMI 24.97 kg/m  General:  well developed, well nourished, in no apparent distress Neck: neck supple without adenopathy, thyromegaly, or masses Lungs:  clear to auscultation, breath sounds equal bilaterally, normal respiratory effort without accessory muscle use Cardio:  regular rate and rhythm without murmurs Abdomen:  abdomen soft, nontender; bowel sounds normal; no masses or organomegaly Neuro:  deep tendon reflexes normal and symmetric and no cerebellar signs or ataxia noted Psych: well oriented with normal range of affect and age-appropriate judgement/insight  Assessment and Plan  Anxiety - Plan: hydrOXYzine (ATARAX/VISTARIL) 50 MG tablet  Cessation of tobacco use in previous 12 months  Orders as above.Anxiety coping mechs given. Discussed alcohol withdrawal and how he is likely outside the window for anything dangerous happening.  For his cough, he is likely having the cilia regrow in his airway that is causing this. Number for [place] provided in AVS. F/u in 1 mo. Patient voiced understanding and agreement to the plan.  [place], DO 01/29/17 11:57 AM

## 2017-01-29 NOTE — Patient Instructions (Signed)
Coping skills Choose 5 that work for you:  Take a deep breath  Count to 20  Read a book  Do a puzzle  Meditate  Bake  Sing  Knit  Garden  Pray  Go outside  Call a friend  Listen to music  Take a walk  Color  Send a note  Take a bath  Watch a movie  Be alone in a quiet place  Pet an animal  Visit a friend  Journal  Exercise  Stretch   Please consider counseling. Contact [phone] to schedule an appointment or inquire about cost/insurance coverage.

## 2017-01-29 NOTE — Telephone Encounter (Signed)
01/29/17  TCM Hospital Follow Up  Transition Care Management Follow-up Telephone Call  ADMISSION DATE: 01/25/17  DISCHARGE DATE: 01/28/17   How have you been since you were released from the hospital? Patient went to ED yesterday 01/28/17 for heart irregular heart beat. States EKG was done and he was prescribed Hydralizine.     Do you understand why you were in the hospital? Yes per patient.   Do you understand the discharge instrcutions? Yes per patient    Items Reviewed:  Medications reviewed: Yes   Allergies reviewed: NKDA   Dietary changes reviewed: Patient states he was not given a particular diet to go by upon discharge. Advised to use Heart Healthy Low Sodium diet.   Referrals reviewed: Appointment scheduled for follow up with provider 02/07/17 @7am  per patient request. Advised of the importance of arriving on time since he is first patient of the day and a 30 minute appointment. Patient agreed .    Functional Questionnaire:   Activities of Daily Living (ADLs): Per patient he can perform all independently.  Any patient concerns? Patient has developed cough on last day of hospitalization.    Confirmed importance and date/time of follow-up visits scheduled: Yes  Confirmed with patient if condition begins to worsen call PCP or go to the ER. Yes    Patient was given the office number and encouragred to call back with questions or concerns. Yes

## 2017-01-29 NOTE — Progress Notes (Signed)
Pre visit review using our clinic review tool, if applicable. No additional management support is needed unless otherwise documented below in the visit note. 

## 2017-01-31 ENCOUNTER — Ambulatory Visit: Payer: Commercial Managed Care - PPO | Admitting: Family Medicine

## 2017-02-05 ENCOUNTER — Telehealth: Payer: Self-pay | Admitting: Family Medicine

## 2017-02-05 NOTE — Telephone Encounter (Signed)
Wife informed ok

## 2017-02-05 NOTE — Telephone Encounter (Signed)
Copied from [place] [id number]. Topic: Inquiry >> Feb 04, 2017  8:22 AM Pricilla Handler wrote: Reason for CRM: Patient's wife called for patient. Patient wants a physical appt on Friday 02[birth year]07[birth year]2017 which is his 41th Birthday. Would Dr. Nani Ravens consider seeing him for a physical on Friday. The only two available slots are both after other physicals. Please call patient's wife ASAP today at [phone].        Thank You!!!

## 2017-02-05 NOTE — Telephone Encounter (Signed)
That's fine

## 2017-02-07 ENCOUNTER — Inpatient Hospital Stay: Payer: Commercial Managed Care - PPO | Admitting: Family Medicine

## 2017-02-17 ENCOUNTER — Encounter: Payer: Self-pay | Admitting: Family Medicine

## 2017-02-17 ENCOUNTER — Ambulatory Visit (INDEPENDENT_AMBULATORY_CARE_PROVIDER_SITE_OTHER): Payer: Commercial Managed Care - PPO | Admitting: Family Medicine

## 2017-02-17 VITALS — BP 112/78 | HR 83 | Temp 98.3°F | Ht 72.0 in | Wt 177.0 lb

## 2017-02-17 DIAGNOSIS — Z0000 Encounter for general adult medical examination without abnormal findings: Secondary | ICD-10-CM | POA: Diagnosis not present

## 2017-02-17 DIAGNOSIS — Z23 Encounter for immunization: Secondary | ICD-10-CM | POA: Diagnosis not present

## 2017-02-17 NOTE — Progress Notes (Signed)
Chief Complaint  Patient presents with  . Annual Exam    Well Male Devin [name]. is here for a complete physical.   His last physical was >1 year ago.  Current diet: in general, a "healthy" diet   Current exercise: Some walking Weight trend: stable Does pt snore? No. Daytime fatigue? No. Seat belt? Yes.    Health maintenance Tetanus- Yes  HIV- Yes Prostate cancer screening- No  Past Medical History:  Diagnosis Date  . CHF (congestive heart failure) ([place])   . Hyperlipidemia   . Hypertension     Past Surgical History:  Procedure Laterality Date  . CARDIOVERSION N/A 01/27/2017   Procedure: CARDIOVERSION;  Surgeon: Sanda Klein, MD;  Location: [facility] ENDOSCOPY;  Service: Cardiovascular;  Laterality: N/A;  . NO PAST SURGERIES    . TEE WITHOUT CARDIOVERSION N/A 01/27/2017   Procedure: TRANSESOPHAGEAL ECHOCARDIOGRAM (TEE);  Surgeon: Sanda Klein, MD;  Location: [facility] ENDOSCOPY;  Service: Cardiovascular;  Laterality: N/A;   Medications  Current Outpatient Medications on File Prior to Visit  Medication Sig Dispense Refill  . acetaminophen (TYLENOL) 500 MG tablet Take 1 tablet (500 mg total) by mouth every 6 (six) hours as needed for mild pain (or Fever >/= 101). 1 tablet 0  . apixaban (ELIQUIS) 5 MG TABS tablet Take 1 tablet (5 mg total) by mouth 2 (two) times daily. 60 tablet 0  . carvedilol (COREG) 3.125 MG tablet Take 1 tablet (3.125 mg total) by mouth 2 (two) times daily with a meal. 60 tablet 0  . hydrOXYzine (ATARAX/VISTARIL) 50 MG tablet Take 1-2 tablets (50-100 mg total) by mouth 3 (three) times daily as needed for anxiety. 90 tablet 1  . lisinopril (PRINIVIL,ZESTRIL) 5 MG tablet Take 1 tablet (5 mg total) by mouth daily. 90 tablet 3   Allergies No Known Allergies Family History Family History  Problem Relation Age of Onset  . Diabetes Mother   . Hypertension Mother   . Hypertension Father   . Hypertension Sister   . Asthma Son   . Cancer Maternal Grandfather    . Cancer Paternal Grandmother        cancer  . Heart attack Paternal Grandfather     Review of Systems: Constitutional: no fevers or chills Eye:  no recent significant change in vision Ear/Nose/Mouth/Throat:  Ears:  no tinnitus or hearing loss Nose/Mouth/Throat:  no complaints of nasal congestion or bleeding, no sore throat Cardiovascular:  no chest pain, no palpitations Respiratory:  no cough and no shortness of breath Gastrointestinal:  no abdominal pain, no change in bowel habits, no nausea, vomiting, diarrhea, or constipation and no black or bloody stool GU:  Male: negative for dysuria, frequency, and incontinence and negative for prostate symptoms Musculoskeletal/Extremities:  no pain, redness, or swelling of the joints Integumentary (Skin/Breast):  no abnormal skin lesions reported Neurologic:  no headaches, no numbness, tingling Endocrine: No unexpected weight changes Hematologic/Lymphatic:  no night sweats  Exam BP 112/78 (BP Location: Left Arm, Patient Position: Sitting, Cuff Size: Normal)   Pulse 83   Temp 98.3 F (36.8 C) (Oral)   Ht 6' (1.829 m)   Wt 177 lb (80.3 kg)   SpO2 100%   BMI 24.01 kg/m  General:  well developed, well nourished, in no apparent distress Skin:  no significant moles, warts, or growths Head:  no masses, lesions, or tenderness Eyes:  pupils equal and round, sclera anicteric without injection Ears:  canals without lesions, TMs shiny without retraction, no obvious effusion, no erythema  Nose:  nares patent, septum midline, mucosa normal Throat/Pharynx:  lips and gingiva without lesion; tongue and uvula midline; non-inflamed pharynx; no exudates or postnasal drainage Neck: neck supple without adenopathy, thyromegaly, or masses Lungs:  clear to auscultation, breath sounds equal bilaterally, no respiratory distress Cardio:  regular rate and rhythm without murmurs, heart sounds without clicks or rubs Abdomen:  abdomen soft, nontender; bowel sounds  normal; no masses or organomegaly Genital (male): circumcised penis, no lesions or discharge; testes present bilaterally without masses or tenderness Rectal: Deferred Musculoskeletal:  symmetrical muscle groups noted without atrophy or deformity Extremities:  no clubbing, cyanosis, or edema, no deformities, no skin discoloration Neuro:  gait normal; deep tendon reflexes normal and symmetric Psych: well oriented with normal range of affect and appropriate judgment/insight  Assessment and Plan  Well adult exam   Well 41 y.o. male. Counseled on diet and exercise. Counseled on risks and benefits of prostate cancer screening. He elected to forego testing for now. Recent testing unremarkable. Will hold off on labs today. Form for work filled out.  Follow up in 6 mo for med check. The patient voiced understanding and agreement to the plan.  [place], DO 02/17/17 11:45 AM

## 2017-02-17 NOTE — Progress Notes (Signed)
Pre visit review using our clinic review tool, if applicable. No additional management support is needed unless otherwise documented below in the visit note. 

## 2017-02-17 NOTE — Patient Instructions (Signed)
Let me know if you change your mind about the PSA testing. I think you made the right choice though.   Aim to do some physical exertion for 150 minutes per week. This is typically divided into 5 days per week, 30 minutes per day. The activity should be enough to get your heart rate up. Anything is better than nothing if you have time constraints.  Let us know if you need anything.

## 2017-02-21 ENCOUNTER — Ambulatory Visit: Payer: Commercial Managed Care - PPO | Admitting: Family Medicine

## 2017-02-21 DIAGNOSIS — Z0289 Encounter for other administrative examinations: Secondary | ICD-10-CM

## 2017-02-25 ENCOUNTER — Encounter (HOSPITAL_COMMUNITY): Payer: Self-pay | Admitting: Cardiology

## 2017-02-25 ENCOUNTER — Ambulatory Visit (HOSPITAL_COMMUNITY)
Admission: RE | Admit: 2017-02-25 | Discharge: 2017-02-25 | Disposition: A | Discharge status: Home / Self Care | Payer: Commercial Managed Care - PPO | Source: Ambulatory Visit | Attending: Cardiology | Admitting: Cardiology

## 2017-02-25 ENCOUNTER — Other Ambulatory Visit: Payer: Self-pay

## 2017-02-25 VITALS — BP 135/87 | HR 83 | Wt 183.5 lb

## 2017-02-25 DIAGNOSIS — I110 Hypertensive heart disease with heart failure: Secondary | ICD-10-CM | POA: Insufficient documentation

## 2017-02-25 DIAGNOSIS — Z87891 Personal history of nicotine dependence: Secondary | ICD-10-CM | POA: Diagnosis not present

## 2017-02-25 DIAGNOSIS — I5022 Chronic systolic (congestive) heart failure: Secondary | ICD-10-CM

## 2017-02-25 DIAGNOSIS — F1010 Alcohol abuse, uncomplicated: Secondary | ICD-10-CM | POA: Insufficient documentation

## 2017-02-25 DIAGNOSIS — I429 Cardiomyopathy, unspecified: Secondary | ICD-10-CM | POA: Diagnosis not present

## 2017-02-25 DIAGNOSIS — I5023 Acute on chronic systolic (congestive) heart failure: Secondary | ICD-10-CM | POA: Diagnosis not present

## 2017-02-25 DIAGNOSIS — Z7901 Long term (current) use of anticoagulants: Secondary | ICD-10-CM | POA: Diagnosis not present

## 2017-02-25 DIAGNOSIS — I483 Typical atrial flutter: Secondary | ICD-10-CM | POA: Diagnosis not present

## 2017-02-25 DIAGNOSIS — E785 Hyperlipidemia, unspecified: Secondary | ICD-10-CM | POA: Insufficient documentation

## 2017-02-25 DIAGNOSIS — Z79899 Other long term (current) drug therapy: Secondary | ICD-10-CM | POA: Insufficient documentation

## 2017-02-25 DIAGNOSIS — I4892 Unspecified atrial flutter: Secondary | ICD-10-CM | POA: Insufficient documentation

## 2017-02-25 DIAGNOSIS — I4581 Long QT syndrome: Secondary | ICD-10-CM | POA: Diagnosis not present

## 2017-02-25 LAB — BASIC METABOLIC PANEL
ANION GAP: 7 (ref 5–15)
BUN: 8 mg/dL (ref 6–20)
CHLORIDE: 103 mmol/L (ref 101–111)
CO2: 27 mmol/L (ref 22–32)
Calcium: 9.1 mg/dL (ref 8.9–10.3)
Creatinine, Ser: 1 mg/dL (ref 0.61–1.24)
GFR calc Af Amer: >60 mL/min (ref >60)
GFR calc non Af Amer: >60 mL/min (ref >60)
GLUCOSE: 85 mg/dL (ref 65–99)
POTASSIUM: 4.3 mmol/L (ref 3.5–5.1)
Sodium: 137 mmol/L (ref 135–145)

## 2017-02-25 MED ORDER — SACUBITRIL-VALSARTAN 24-26 MG PO TABS: 1.0000 | ORAL_TABLET | Freq: Two times a day (BID) | ORAL | 3 refills | Status: DC

## 2017-02-25 NOTE — Progress Notes (Signed)
PCP: Dr. Nani Ravens Cardiology: Dr. Aundra Dubin  42 yo with history of HTN, paroxysmal atrial flutter, and cardiomyopathy was referred by Dr. Johnsie Cancel for CHF clinic evaluation of cardiomyopathy.    Patient had history of untreated hypertension (diagnosed summer 2018) and hyperlipidemia.  He was admitted in 12/18 with epigastric pain, orthopnea, and dyspnea that had gone on for several days.  He did not free palpitations.  He was noted to be in atrial flutter with RVR and in CHF.  Prior to this episode, he had no exertional symptoms. Echo was done, showing EF 20-25% with moderately decreased RV systolic function.  Of note, prior to admission he was drinking up to 12 beers/day.  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  In the hospital, he was diuresed and cardioverted.  Today, he remains in NSR.   Currently, Mr Kashuba seems to be doing well.  He is no longer drinking ETOH or smoking.  No palpitations (but did not feel this last admission when he was in atrial flutter with RVR.  No significant exertional dyspnea.  Able to do all ADLs without issues.  No orthopnea/PND.  No chest pain.  He does not, of note, have daytime sleepiness and does not snore.  No lightheadedness.    ECG (personally reviewed): NSR, LVH, anterolateral TWIs.   Labs (10/18): HIV negative, normal Fe studies.  Labs (12/18): K 4, creatinine 1.35, hgb 14.7, decreased TSH, normal free T3 and free T4  PMH: 1. HTN 2. Hyperlipidemia 3. ETOH abuse 4. Atrial flutter: Paroxysmal.  1st noted in 12/18.  - DCCV 12/18.  5. Chronic systolic CHF: Uncertain etiology.  HTN, ETOH abuse could play a role.  Also consider tachy-mediated CMP in setting of atrial flutter with RVR.   - Echo (12/18): EF 20-25%, mild LV dilation, mild LVH, mild-moderate MR, moderately decreased RV systolic function, PASP 44 mmHg.  - TEE (12/18): EF 10-15%, moderate LV dilation, mild-moderate MR, moderately decreased RV systolic function.   SH: Lives in  [place], married, works as Therapist, music.  Drank 12 beers/night prior to 12/18 admission, now has quit.  Quit smoking in 12/18.   FH: Uncle with pacemaker, aunt with MI.  Parents/siblings no cardiac history.   ROS: All systems reviewed and negative except as per HPI.   Current Outpatient Medications  Medication Sig Dispense Refill  . acetaminophen (TYLENOL) 500 MG tablet Take 1 tablet (500 mg total) by mouth every 6 (six) hours as needed for mild pain (or Fever >/= 101). 1 tablet 0  . apixaban (ELIQUIS) 5 MG TABS tablet Take 1 tablet (5 mg total) by mouth 2 (two) times daily. 60 tablet 0  . carvedilol (COREG) 3.125 MG tablet Take 1 tablet (3.125 mg total) by mouth 2 (two) times daily with a meal. 60 tablet 0  . hydrOXYzine (ATARAX/VISTARIL) 50 MG tablet Take 1-2 tablets (50-100 mg total) by mouth 3 (three) times daily as needed for anxiety. 90 tablet 1  . sacubitril-valsartan (ENTRESTO) 24-26 MG Take 1 tablet by mouth 2 (two) times daily. 60 tablet 3   No current facility-administered medications for this encounter.    BP 135/87   Pulse 83   Wt 183 lb 8 oz (83.2 kg)   SpO2 100%   BMI 24.89 kg/m  General: NAD Neck: No JVD, no thyromegaly or thyroid nodule.  Lungs: Clear to auscultation bilaterally with normal respiratory effort. CV: Nondisplaced PMI.  Heart regular S1/S2, no S3/S4, no murmur.  No peripheral  edema.  No carotid bruit.  Normal pedal pulses.  Abdomen: Soft, nontender, no hepatosplenomegaly, no distention.  Skin: Intact without lesions or rashes.  Neurologic: Alert and oriented x 3.  Psych: Normal affect. Extremities: No clubbing or cyanosis.  HEENT: Normal.   Assessment/Plan: 1. Acute on chronic systolic CHF: Echo in 95/09 with EF 20-25%, moderately decreased RV systolic function.   Possible causes are tachycardia-mediated CMP (in atrial flutter with RVR 12/18 admission), long-standing poorly controlled HTN, and heavy ETOH.  HIV negative, Fe studies negative.  We  have not ruled out CAD though no chest pain or family history (he did smoke).  On exam today, he is euvolemic.  NYHA class I-II symptoms.  - Repeat echo.  If EF remains abnormal in NSR, will arrange for RHC/LHC to assess for coronary disease and check hemodynamics.  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  - Stop lisinopril, start Entresto 24/26 bid in 36 hrs. BMET today and repeat in 2 wks.  - Continue Coreg.  - Next step would be to start spironolactone.  2. HTN: BP appears controlled.  Changing to [facility] as above.  3. ETOH abuse: Patient has quit drinking.   4. Atrial flutter: Paroxysmal.  Possible tachycardia-mediated cardiomyopathy.  I think that an atrial flutter ablation would be very reasonable here.  - I will refer to Dr. Lovena Le who saw him in the hospital for atrial flutter ablation.  - If EF improves and he stays in NSR after flutter ablation, may be able to stop apixaban.   Loralie Champagne 02/25/2017

## 2017-02-25 NOTE — Patient Instructions (Signed)
Stop Lisinopril   Start Entresto 24/26 mg (1 tab), twice a day  Labs drawn today (if we do not call you, then your lab work was stable)   Your physician has requested that you have an echocardiogram. Echocardiography is a painless test that uses sound waves to create images of your heart. It provides your doctor with information about the size and shape of your heart and how well your heart's chambers and valves are working. This procedure takes approximately one hour. There are no restrictions for this procedure.  You have been referred to Dr. Lovena Le follow up for an ablation for A-fib  Your physician recommends that you return for lab work in: 2 weeks   Your physician recommends that you schedule a follow-up appointment in: 2 weeks with Dr. Aundra Dubin  an a echocardiogram

## 2017-03-11 ENCOUNTER — Encounter (HOSPITAL_COMMUNITY): Payer: Self-pay

## 2017-03-11 ENCOUNTER — Ambulatory Visit (HOSPITAL_BASED_OUTPATIENT_CLINIC_OR_DEPARTMENT_OTHER)
Admission: RE | Admit: 2017-03-11 | Discharge: 2017-03-11 | Disposition: A | Discharge status: Home / Self Care | Payer: Commercial Managed Care - PPO | Source: Ambulatory Visit | Attending: Cardiology | Admitting: Cardiology

## 2017-03-11 ENCOUNTER — Other Ambulatory Visit: Payer: Self-pay

## 2017-03-11 ENCOUNTER — Ambulatory Visit (HOSPITAL_COMMUNITY)
Admission: RE | Admit: 2017-03-11 | Discharge: 2017-03-11 | Disposition: A | Discharge status: Home / Self Care | Payer: Commercial Managed Care - PPO | Source: Ambulatory Visit | Attending: Cardiology | Admitting: Cardiology

## 2017-03-11 ENCOUNTER — Other Ambulatory Visit (HOSPITAL_COMMUNITY): Payer: Self-pay

## 2017-03-11 ENCOUNTER — Encounter (HOSPITAL_COMMUNITY): Payer: Self-pay | Admitting: Cardiology

## 2017-03-11 VITALS — BP 140/80 | HR 78 | Wt 181.2 lb

## 2017-03-11 DIAGNOSIS — Z7901 Long term (current) use of anticoagulants: Secondary | ICD-10-CM | POA: Insufficient documentation

## 2017-03-11 DIAGNOSIS — I483 Typical atrial flutter: Secondary | ICD-10-CM

## 2017-03-11 DIAGNOSIS — I5022 Chronic systolic (congestive) heart failure: Secondary | ICD-10-CM

## 2017-03-11 DIAGNOSIS — Z79899 Other long term (current) drug therapy: Secondary | ICD-10-CM | POA: Diagnosis not present

## 2017-03-11 DIAGNOSIS — I429 Cardiomyopathy, unspecified: Secondary | ICD-10-CM | POA: Diagnosis not present

## 2017-03-11 DIAGNOSIS — F1010 Alcohol abuse, uncomplicated: Secondary | ICD-10-CM | POA: Diagnosis not present

## 2017-03-11 DIAGNOSIS — Z87891 Personal history of nicotine dependence: Secondary | ICD-10-CM | POA: Diagnosis not present

## 2017-03-11 DIAGNOSIS — E785 Hyperlipidemia, unspecified: Secondary | ICD-10-CM | POA: Diagnosis not present

## 2017-03-11 DIAGNOSIS — I110 Hypertensive heart disease with heart failure: Secondary | ICD-10-CM | POA: Insufficient documentation

## 2017-03-11 DIAGNOSIS — I4892 Unspecified atrial flutter: Secondary | ICD-10-CM | POA: Insufficient documentation

## 2017-03-11 DIAGNOSIS — I5021 Acute systolic (congestive) heart failure: Secondary | ICD-10-CM

## 2017-03-11 DIAGNOSIS — Z8249 Family history of ischemic heart disease and other diseases of the circulatory system: Secondary | ICD-10-CM | POA: Insufficient documentation

## 2017-03-11 LAB — CBC
HCT: 50.9 % (ref 39.0–52.0)
Hemoglobin: 17 g/dL (ref 13.0–17.0)
MCH: 29.4 pg (ref 26.0–34.0)
MCHC: 33.4 g/dL (ref 30.0–36.0)
MCV: 88.1 fL (ref 78.0–100.0)
PLATELETS: 240 10*3/uL (ref 150–400)
RBC: 5.78 MIL/uL (ref 4.22–5.81)
RDW: 14.2 % (ref 11.5–15.5)
WBC: 5.3 10*3/uL (ref 4.0–10.5)

## 2017-03-11 LAB — BASIC METABOLIC PANEL
ANION GAP: 12 (ref 5–15)
BUN: 6 mg/dL (ref 6–20)
CALCIUM: 9.3 mg/dL (ref 8.9–10.3)
CO2: 23 mmol/L (ref 22–32)
Chloride: 103 mmol/L (ref 101–111)
Creatinine, Ser: 1.08 mg/dL (ref 0.61–1.24)
GFR calc Af Amer: >60 mL/min (ref >60)
Glucose, Bld: 104 mg/dL — ABNORMAL HIGH (ref 65–99)
Potassium: 5 mmol/L (ref 3.5–5.1)
SODIUM: 138 mmol/L (ref 135–145)

## 2017-03-11 LAB — PROTIME-INR
INR: 1.12
Prothrombin Time: 14.3 seconds (ref 11.4–15.2)

## 2017-03-11 MED ORDER — SPIRONOLACTONE 25 MG PO TABS: 12.5000 mg | ORAL_TABLET | Freq: Every day | ORAL | 3 refills | Status: DC

## 2017-03-11 MED ORDER — CARVEDILOL 6.25 MG PO TABS: 6.2500 mg | ORAL_TABLET | Freq: Two times a day (BID) | ORAL | 3 refills | Status: DC

## 2017-03-11 NOTE — Progress Notes (Signed)
PCP: Dr. Nani Ravens Cardiology: Dr. Aundra Dubin  42 yo with history of HTN, paroxysmal atrial flutter, and cardiomyopathy presents for followup of CHF.   Patient had history of untreated hypertension (diagnosed summer 2018) and hyperlipidemia.  He was admitted in 12/18 with epigastric pain, orthopnea, and dyspnea that had gone on for several days.  He did not free palpitations.  He was noted to be in atrial flutter with RVR and in CHF.  Prior to this episode, he had no exertional symptoms. Echo was done, showing EF 20-25% with moderately decreased RV systolic function.  Of note, prior to admission he was drinking up to 12 beers/day.  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  In the hospital, he was diuresed and cardioverted.  Today, he remains in NSR.   He is doing well symptomatically  He is no longer drinking ETOH or smoking.  No palpitations. No significant exertional dyspnea. No orthopnea/PND.  No chest pain.  Really no exercise limitation at this time.  Echo was reviewed today, EF remains 25-30% with diffuse hypokinesis.  He does not always take his twice daily meds twice daily.   Labs (10/18): HIV negative, normal Fe studies.  Labs (12/18): K 4, creatinine 1.35, hgb 14.7, decreased TSH, normal free T3 and free T4 Labs (1/19): K 4.3, creatinine 1.0  PMH: 1. HTN 2. Hyperlipidemia 3. ETOH abuse 4. Atrial flutter: Paroxysmal.  1st noted in 12/18.  - DCCV 12/18.  5. Chronic systolic CHF: Uncertain etiology.  HIV negative 10/18.  HTN, ETOH abuse could play a role.  Also consider tachy-mediated CMP in setting of atrial flutter with RVR.   - Echo (12/18): EF 20-25%, mild LV dilation, mild LVH, mild-moderate MR, moderately decreased RV systolic function, PASP 44 mmHg.  - TEE (12/18): EF 10-15%, moderate LV dilation, mild-moderate MR, moderately decreased RV systolic function.  - Echo (1/19): EF 25-30%, diffuse hypokinesis, moderate diastolic dysfunction, normal RV size and systolic  function.   SH: Lives in [place], married, works as Therapist, music.  Drank 12 beers/night prior to 12/18 admission, now has quit.  Quit smoking in 12/18.   FH: Uncle with pacemaker, aunt with MI.  Parents/siblings no cardiac history.   ROS: All systems reviewed and negative except as per HPI.   Current Outpatient Medications  Medication Sig Dispense Refill  . acetaminophen (TYLENOL) 500 MG tablet Take 1 tablet (500 mg total) by mouth every 6 (six) hours as needed for mild pain (or Fever >/= 101). 1 tablet 0  . apixaban (ELIQUIS) 5 MG TABS tablet Take 1 tablet (5 mg total) by mouth 2 (two) times daily. 60 tablet 0  . carvedilol (COREG) 6.25 MG tablet Take 1 tablet (6.25 mg total) by mouth 2 (two) times daily with a meal. 60 tablet 3  . hydrOXYzine (ATARAX/VISTARIL) 50 MG tablet Take 1-2 tablets (50-100 mg total) by mouth 3 (three) times daily as needed for anxiety. 90 tablet 1  . sacubitril-valsartan (ENTRESTO) 24-26 MG Take 1 tablet by mouth 2 (two) times daily. 60 tablet 3  . spironolactone (ALDACTONE) 25 MG tablet Take 0.5 tablets (12.5 mg total) by mouth daily. 15 tablet 3   No current facility-administered medications for this encounter.    BP 140/80   Pulse 78   Wt 181 lb 4 oz (82.2 kg)   SpO2 100%   BMI 24.58 kg/m  General: NAD Neck: No JVD, no thyromegaly or thyroid nodule.  Lungs: Clear to auscultation bilaterally with normal  respiratory effort. CV: Nondisplaced PMI.  Heart regular S1/S2, no S3/S4, no murmur.  No peripheral edema.  No carotid bruit.  Normal pedal pulses.  Abdomen: Soft, nontender, no hepatosplenomegaly, no distention.  Skin: Intact without lesions or rashes.  Neurologic: Alert and oriented x 3.  Psych: Normal affect. Extremities: No clubbing or cyanosis.  HEENT: Normal.   Assessment/Plan: 1. Chronic systolic CHF: Echo in 95/28 with EF 20-25%, moderately decreased RV systolic function.   Possible causes are tachycardia-mediated CMP (in atrial flutter  with RVR 12/18 admission), long-standing poorly controlled HTN, viral myocarditis, and heavy ETOH.  HIV negative, Fe studies negative.  We have not ruled out CAD though no chest pain or family history (he did smoke).  On exam today, he is euvolemic.  NYHA class I symptoms.  Echo was reviewed today, EF remains 25-30% despite holding sinus rhythm.  With lack of improvement in EF, tachycardia-mediated CMP seems less likely.  - With lack of EF recovery in NSR, I am going to arrange for RHC/LHC to assess for coronary disease and check hemodynamics.  We discussed risks/benefits of cath and he agreed to proceed.  - If no CAD on cath, he will need cardiac MRI to assess for infiltrative disease.  - Continue current Entresto (make sure to take twice a day).  - Increase Coreg to 6.25 mg bid.  - Add spironolactone 12.5 mg daily.  BMET today and again in 10 days.   - Echo in 6/19 for ICD (at 6 months post diagnosis) => would not be CRT candidate with narrow QRS.  2. HTN: BP borderline elevated.  Increasing Coreg and adding spironolactone.  3. ETOH abuse: Patient has quit drinking.   4. Atrial flutter: Paroxysmal.  I was concerned for tachy-mediated cardiomyopathy, but EF not significantly improved today after > 1 month in NSR.  Given decompensation with atrial flutter, I think that an atrial flutter ablation would be very reasonable here.  - He needs to see EP to discuss ablation.   - Continue apixaban.   He will be seen in HF pharmacy clinic in 2 wks for med titration and will see me again in 1 month.    Loralie Champagne 03/11/2017

## 2017-03-11 NOTE — Patient Instructions (Signed)
Start Spironolactone 12.5 mg (1/2 tab) daily  Increase Carvedilol 6.25 mg (1 tab), TWICE A DAY  *ELEQUIS and ENTRESTO SHOULD BE TAKEN TWICE A DAY*  Labs drawn today (if we do not call you, then your lab work was stable)   You have been referred to Dr. Lovena Le (there office will call you)   Your physician has requested that you have a cardiac catheterization. Cardiac catheterization is used to diagnose and/or treat various heart conditions. Doctors may recommend this procedure for a number of different reasons. The most common reason is to evaluate chest pain. Chest pain can be a symptom of coronary artery disease (CAD), and cardiac catheterization can show whether plaque is narrowing or blocking your heart's arteries. This procedure is also used to evaluate the valves, as well as measure the blood flow and oxygen levels in different parts of your heart. For further information please visit [email or web address]. Please follow instruction sheet, as given.  Your physician recommends that you schedule a follow-up appointment in: With Abbe Amsterdam D in 2 weeks  Your physician recommends that you schedule a follow-up appointment in: 1 month with Dr. Aundra Dubin

## 2017-03-11 NOTE — H&P (View-Only) (Signed)
PCP: Dr. Nani Ravens Cardiology: Dr. Aundra Dubin  42 yo with history of HTN, paroxysmal atrial flutter, and cardiomyopathy presents for followup of CHF.   Patient had history of untreated hypertension (diagnosed summer 2018) and hyperlipidemia.  He was admitted in 12/18 with epigastric pain, orthopnea, and dyspnea that had gone on for several days.  He did not free palpitations.  He was noted to be in atrial flutter with RVR and in CHF.  Prior to this episode, he had no exertional symptoms. Echo was done, showing EF 20-25% with moderately decreased RV systolic function.  Of note, prior to admission he was drinking up to 12 beers/day.  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  In the hospital, he was diuresed and cardioverted.  Today, he remains in NSR.   He is doing well symptomatically  He is no longer drinking ETOH or smoking.  No palpitations. No significant exertional dyspnea. No orthopnea/PND.  No chest pain.  Really no exercise limitation at this time.  Echo was reviewed today, EF remains 25-30% with diffuse hypokinesis.  He does not always take his twice daily meds twice daily.   Labs (10/18): HIV negative, normal Fe studies.  Labs (12/18): K 4, creatinine 1.35, hgb 14.7, decreased TSH, normal free T3 and free T4 Labs (1/19): K 4.3, creatinine 1.0  PMH: 1. HTN 2. Hyperlipidemia 3. ETOH abuse 4. Atrial flutter: Paroxysmal.  1st noted in 12/18.  - DCCV 12/18.  5. Chronic systolic CHF: Uncertain etiology.  HIV negative 10/18.  HTN, ETOH abuse could play a role.  Also consider tachy-mediated CMP in setting of atrial flutter with RVR.   - Echo (12/18): EF 20-25%, mild LV dilation, mild LVH, mild-moderate MR, moderately decreased RV systolic function, PASP 44 mmHg.  - TEE (12/18): EF 10-15%, moderate LV dilation, mild-moderate MR, moderately decreased RV systolic function.  - Echo (1/19): EF 25-30%, diffuse hypokinesis, moderate diastolic dysfunction, normal RV size and systolic  function.   SH: Lives in [place], married, works as Therapist, music.  Drank 12 beers/night prior to 12/18 admission, now has quit.  Quit smoking in 12/18.   FH: Uncle with pacemaker, aunt with MI.  Parents/siblings no cardiac history.   ROS: All systems reviewed and negative except as per HPI.   Current Outpatient Medications  Medication Sig Dispense Refill  . acetaminophen (TYLENOL) 500 MG tablet Take 1 tablet (500 mg total) by mouth every 6 (six) hours as needed for mild pain (or Fever >/= 101). 1 tablet 0  . apixaban (ELIQUIS) 5 MG TABS tablet Take 1 tablet (5 mg total) by mouth 2 (two) times daily. 60 tablet 0  . carvedilol (COREG) 6.25 MG tablet Take 1 tablet (6.25 mg total) by mouth 2 (two) times daily with a meal. 60 tablet 3  . hydrOXYzine (ATARAX/VISTARIL) 50 MG tablet Take 1-2 tablets (50-100 mg total) by mouth 3 (three) times daily as needed for anxiety. 90 tablet 1  . sacubitril-valsartan (ENTRESTO) 24-26 MG Take 1 tablet by mouth 2 (two) times daily. 60 tablet 3  . spironolactone (ALDACTONE) 25 MG tablet Take 0.5 tablets (12.5 mg total) by mouth daily. 15 tablet 3   No current facility-administered medications for this encounter.    BP 140/80   Pulse 78   Wt 181 lb 4 oz (82.2 kg)   SpO2 100%   BMI 24.58 kg/m  General: NAD Neck: No JVD, no thyromegaly or thyroid nodule.  Lungs: Clear to auscultation bilaterally with normal  respiratory effort. CV: Nondisplaced PMI.  Heart regular S1/S2, no S3/S4, no murmur.  No peripheral edema.  No carotid bruit.  Normal pedal pulses.  Abdomen: Soft, nontender, no hepatosplenomegaly, no distention.  Skin: Intact without lesions or rashes.  Neurologic: Alert and oriented x 3.  Psych: Normal affect. Extremities: No clubbing or cyanosis.  HEENT: Normal.   Assessment/Plan: 1. Chronic systolic CHF: Echo in 16/83 with EF 20-25%, moderately decreased RV systolic function.   Possible causes are tachycardia-mediated CMP (in atrial flutter  with RVR 12/18 admission), long-standing poorly controlled HTN, viral myocarditis, and heavy ETOH.  HIV negative, Fe studies negative.  We have not ruled out CAD though no chest pain or family history (he did smoke).  On exam today, he is euvolemic.  NYHA class I symptoms.  Echo was reviewed today, EF remains 25-30% despite holding sinus rhythm.  With lack of improvement in EF, tachycardia-mediated CMP seems less likely.  - With lack of EF recovery in NSR, I am going to arrange for RHC/LHC to assess for coronary disease and check hemodynamics.  We discussed risks/benefits of cath and he agreed to proceed.  - If no CAD on cath, he will need cardiac MRI to assess for infiltrative disease.  - Continue current Entresto (make sure to take twice a day).  - Increase Coreg to 6.25 mg bid.  - Add spironolactone 12.5 mg daily.  BMET today and again in 10 days.   - Echo in 6/19 for ICD (at 6 months post diagnosis) => would not be CRT candidate with narrow QRS.  2. HTN: BP borderline elevated.  Increasing Coreg and adding spironolactone.  3. ETOH abuse: Patient has quit drinking.   4. Atrial flutter: Paroxysmal.  I was concerned for tachy-mediated cardiomyopathy, but EF not significantly improved today after > 1 month in NSR.  Given decompensation with atrial flutter, I think that an atrial flutter ablation would be very reasonable here.  - He needs to see EP to discuss ablation.   - Continue apixaban.   He will be seen in HF pharmacy clinic in 2 wks for med titration and will see me again in 1 month.    Loralie Champagne 03/11/2017

## 2017-03-21 ENCOUNTER — Ambulatory Visit (HOSPITAL_COMMUNITY)
Admission: RE | Admit: 2017-03-21 | Discharge: 2017-03-21 | Disposition: A | Discharge status: Home / Self Care | Payer: Commercial Managed Care - PPO | Source: Ambulatory Visit | Attending: Cardiology | Admitting: Cardiology

## 2017-03-21 ENCOUNTER — Encounter (HOSPITAL_COMMUNITY): Payer: Self-pay | Admitting: Cardiology

## 2017-03-21 ENCOUNTER — Encounter (HOSPITAL_COMMUNITY)
Admission: RE | Disposition: A | Discharge status: Home / Self Care | Payer: Self-pay | Source: Ambulatory Visit | Attending: Cardiology

## 2017-03-21 DIAGNOSIS — I429 Cardiomyopathy, unspecified: Secondary | ICD-10-CM | POA: Diagnosis not present

## 2017-03-21 DIAGNOSIS — Z8249 Family history of ischemic heart disease and other diseases of the circulatory system: Secondary | ICD-10-CM | POA: Insufficient documentation

## 2017-03-21 DIAGNOSIS — I509 Heart failure, unspecified: Secondary | ICD-10-CM | POA: Diagnosis not present

## 2017-03-21 DIAGNOSIS — I5022 Chronic systolic (congestive) heart failure: Secondary | ICD-10-CM

## 2017-03-21 DIAGNOSIS — I4892 Unspecified atrial flutter: Secondary | ICD-10-CM | POA: Insufficient documentation

## 2017-03-21 DIAGNOSIS — I110 Hypertensive heart disease with heart failure: Secondary | ICD-10-CM | POA: Insufficient documentation

## 2017-03-21 DIAGNOSIS — Z7901 Long term (current) use of anticoagulants: Secondary | ICD-10-CM | POA: Insufficient documentation

## 2017-03-21 DIAGNOSIS — I428 Other cardiomyopathies: Secondary | ICD-10-CM | POA: Insufficient documentation

## 2017-03-21 DIAGNOSIS — Z87891 Personal history of nicotine dependence: Secondary | ICD-10-CM | POA: Diagnosis not present

## 2017-03-21 DIAGNOSIS — Z79899 Other long term (current) drug therapy: Secondary | ICD-10-CM | POA: Diagnosis not present

## 2017-03-21 DIAGNOSIS — E785 Hyperlipidemia, unspecified: Secondary | ICD-10-CM | POA: Diagnosis not present

## 2017-03-21 HISTORY — PX: RIGHT/LEFT HEART CATH AND CORONARY ANGIOGRAPHY: CATH118266

## 2017-03-21 LAB — POCT I-STAT 3, VENOUS BLOOD GAS (G3P V)
Acid-base deficit: 3 mmol/L — ABNORMAL HIGH (ref 0.0–2.0)
Bicarbonate: 25.2 mmol/L (ref 20.0–28.0)
O2 SAT: 79 %
PCO2 VEN: 53.3 mmHg (ref 44.0–60.0)
PO2 VEN: 49 mmHg — AB (ref 32.0–45.0)
TCO2: 27 mmol/L (ref 22–32)
pH, Ven: 7.282 (ref 7.250–7.430)

## 2017-03-21 SURGERY — RIGHT/LEFT HEART CATH AND CORONARY ANGIOGRAPHY
Anesthesia: LOCAL

## 2017-03-21 MED ORDER — VERAPAMIL HCL 2.5 MG/ML IV SOLN
INTRAVENOUS | Status: AC
  Filled 2017-03-21: qty 2

## 2017-03-21 MED ORDER — SODIUM CHLORIDE 0.9 % IV SOLN: 250.0000 mL | INTRAVENOUS | Status: DC | PRN

## 2017-03-21 MED ORDER — ACETAMINOPHEN 325 MG PO TABS
650.0000 mg | ORAL_TABLET | ORAL | Status: DC | PRN
  Administered 2017-03-21: 650 mg via ORAL

## 2017-03-21 MED ORDER — HEPARIN SODIUM (PORCINE) 1000 UNIT/ML IJ SOLN
INTRAMUSCULAR | Status: AC
  Filled 2017-03-21: qty 1

## 2017-03-21 MED ORDER — SODIUM CHLORIDE 0.9% FLUSH: 3.0000 mL | INTRAVENOUS | Status: DC | PRN

## 2017-03-21 MED ORDER — MIDAZOLAM HCL 2 MG/2ML IJ SOLN
INTRAMUSCULAR | Status: AC
  Filled 2017-03-21: qty 2

## 2017-03-21 MED ORDER — SODIUM CHLORIDE 0.9% FLUSH: 3.0000 mL | Freq: Two times a day (BID) | INTRAVENOUS | Status: DC

## 2017-03-21 MED ORDER — FENTANYL CITRATE (PF) 100 MCG/2ML IJ SOLN: 25.0000 ug | Freq: Once | INTRAMUSCULAR | Status: DC

## 2017-03-21 MED ORDER — HEPARIN SODIUM (PORCINE) 1000 UNIT/ML IJ SOLN
INTRAMUSCULAR | Status: DC | PRN
Start: 2017-03-21 — End: 2017-03-21
  Administered 2017-03-21: 4000 [IU] via INTRAVENOUS

## 2017-03-21 MED ORDER — IOPAMIDOL (ISOVUE-370) INJECTION 76%
INTRAVENOUS | Status: DC | PRN
  Administered 2017-03-21: 70 mL via INTRA_ARTERIAL

## 2017-03-21 MED ORDER — ACETAMINOPHEN 325 MG PO TABS
ORAL_TABLET | ORAL | Status: AC
  Administered 2017-03-21: 650 mg via ORAL
  Filled 2017-03-21: qty 2

## 2017-03-21 MED ORDER — ONDANSETRON HCL 4 MG/2ML IJ SOLN: 4.0000 mg | Freq: Four times a day (QID) | INTRAMUSCULAR | Status: DC | PRN

## 2017-03-21 MED ORDER — HEPARIN (PORCINE) IN NACL 2-0.9 UNIT/ML-% IJ SOLN
INTRAMUSCULAR | Status: AC | PRN
  Administered 2017-03-21: 1000 mL

## 2017-03-21 MED ORDER — FENTANYL CITRATE (PF) 100 MCG/2ML IJ SOLN
INTRAMUSCULAR | Status: DC | PRN
  Administered 2017-03-21 (×2): 25 ug via INTRAVENOUS

## 2017-03-21 MED ORDER — IOPAMIDOL (ISOVUE-370) INJECTION 76%
INTRAVENOUS | Status: AC
  Filled 2017-03-21: qty 100

## 2017-03-21 MED ORDER — FENTANYL CITRATE (PF) 100 MCG/2ML IJ SOLN
INTRAMUSCULAR | Status: AC
  Filled 2017-03-21: qty 2

## 2017-03-21 MED ORDER — SODIUM CHLORIDE 0.9 % IV SOLN: INTRAVENOUS | Status: DC

## 2017-03-21 MED ORDER — LIDOCAINE HCL (PF) 1 % IJ SOLN
INTRAMUSCULAR | Status: DC | PRN
  Administered 2017-03-21 (×2): 2 mL

## 2017-03-21 MED ORDER — MIDAZOLAM HCL 2 MG/2ML IJ SOLN
INTRAMUSCULAR | Status: DC | PRN
  Administered 2017-03-21 (×3): 1 mg via INTRAVENOUS

## 2017-03-21 MED ORDER — VERAPAMIL HCL 2.5 MG/ML IV SOLN
INTRAVENOUS | Status: DC | PRN
  Administered 2017-03-21: 10 mL via INTRA_ARTERIAL

## 2017-03-21 MED ORDER — LIDOCAINE HCL (PF) 1 % IJ SOLN
INTRAMUSCULAR | Status: AC
  Filled 2017-03-21: qty 30

## 2017-03-21 MED ORDER — ASPIRIN 81 MG PO CHEW
81.0000 mg | CHEWABLE_TABLET | ORAL | Status: AC
  Administered 2017-03-21: 81 mg via ORAL

## 2017-03-21 MED ORDER — HEPARIN (PORCINE) IN NACL 2-0.9 UNIT/ML-% IJ SOLN
INTRAMUSCULAR | Status: AC
  Filled 2017-03-21: qty 1000

## 2017-03-21 MED ORDER — SODIUM CHLORIDE 0.9 % IV SOLN
INTRAVENOUS | Status: DC
  Administered 2017-03-21: 06:00:00 via INTRAVENOUS

## 2017-03-21 MED ORDER — ASPIRIN 81 MG PO CHEW
CHEWABLE_TABLET | ORAL | Status: AC
  Administered 2017-03-21: 81 mg via ORAL
  Filled 2017-03-21: qty 1

## 2017-03-21 SURGICAL SUPPLY — 12 items
CATH 5FR JL3.5 JR4 ANG PIG MP (CATHETERS) ×2 IMPLANT
CATH BALLN WEDGE 5F 110CM (CATHETERS) ×2 IMPLANT
DEVICE RAD COMP TR BAND LRG (VASCULAR PRODUCTS) ×2 IMPLANT
GLIDESHEATH SLEND SS 6F .021 (SHEATH) ×2 IMPLANT
GUIDEWIRE INQWIRE 1.5J.035X260 (WIRE) ×1 IMPLANT
INQWIRE 1.5J .035X260CM (WIRE) ×2
KIT HEART LEFT (KITS) ×2 IMPLANT
PACK CARDIAC CATHETERIZATION (CUSTOM PROCEDURE TRAY) ×2 IMPLANT
SHEATH GLIDE SLENDER 4/5FR (SHEATH) ×2 IMPLANT
SYR MEDRAD MARK V 150ML (SYRINGE) ×2 IMPLANT
TRANSDUCER W/STOPCOCK (MISCELLANEOUS) ×2 IMPLANT
TUBING CIL FLEX 10 FLL-RA (TUBING) ×2 IMPLANT

## 2017-03-21 NOTE — Progress Notes (Signed)
**Note Devin [name]-Identified via Obfuscation** Patient complained of numbness/tingling to left thumb and pointing finger and pain to right radial site. Per patient numbness/tingling only lasted for less than five minutes. EKG ordered (see results). Dr. Aundra Dubin notified of EKG results and patient's complaint of pain. One time order given for Fentanyl 25 mcg IV. Attempt made to give patient Fentanyl dose and patient refused to take medication because he does not want to prolong his discharge time. Medication wasted with second RN, Ellin Goodie.

## 2017-03-21 NOTE — Interval H&P Note (Signed)
History and Physical Interval Note:  03/21/2017 7:50 AM  [name]  has presented today for surgery, with the diagnosis of hf  The various methods of treatment have been discussed with the patient and family. After consideration of risks, benefits and other options for treatment, the patient has consented to  Procedure(s): RIGHT/LEFT HEART CATH AND CORONARY ANGIOGRAPHY (N/A) as a surgical intervention .  The patient's history has been reviewed, patient examined, no change in status, stable for surgery.  I have reviewed the patient's chart and labs.  Questions were answered to the patient's satisfaction.     [place]

## 2017-03-21 NOTE — Discharge Instructions (Signed)
**Note Devin [name]-identified via Obfuscation** Radial Site Care °Refer to this sheet in the next few weeks. These instructions provide you with information about caring for yourself after your procedure. Your health care provider may also give you more specific instructions. Your treatment has been planned according to current medical practices, but problems sometimes occur. Call your health care provider if you have any problems or questions after your procedure. °What can I expect after the procedure? °After your procedure, it is typical to have the following: °· Bruising at the radial site that usually fades within 1-2 weeks. °· Blood collecting in the tissue (hematoma) that may be painful to the touch. It should usually decrease in size and tenderness within 1-2 weeks. ° °Follow these instructions at home: °· Take medicines only as directed by your health care provider. °· You may shower 24-48 hours after the procedure or as directed by your health care provider. Remove the bandage (dressing) and gently wash the site with plain soap and water. Pat the area dry with a clean towel. Do not rub the site, because this may cause bleeding. °· Do not take baths, swim, or use a hot tub until your health care provider approves. °· Check your insertion site every day for redness, swelling, or drainage. °· Do not apply powder or lotion to the site. °· Do not flex or bend the affected arm for 24 hours or as directed by your health care provider. °· Do not push or pull heavy objects with the affected arm for 24 hours or as directed by your health care provider. °· Do not lift over 10 lb (4.5 kg) for 5 days after your procedure or as directed by your health care provider. °· Ask your health care provider when it is okay to: °? Return to work or school. °? Resume usual physical activities or sports. °? Resume sexual activity. °· Do not drive home if you are discharged the same day as the procedure. Have someone else drive you. °· You may drive 24 hours after the procedure  unless otherwise instructed by your health care provider. °· Do not operate machinery or power tools for 24 hours after the procedure. °· If your procedure was done as an outpatient procedure, which means that you went home the same day as your procedure, a responsible adult should be with you for the first 24 hours after you arrive home. °· Keep all follow-up visits as directed by your health care provider. This is important. °Contact a health care provider if: °· You have a fever. °· You have chills. °· You have increased bleeding from the radial site. Hold pressure on the site. °Get help right away if: °· You have unusual pain at the radial site. °· You have redness, warmth, or swelling at the radial site. °· You have drainage (other than a small amount of blood on the dressing) from the radial site. °· The radial site is bleeding, and the bleeding does not stop after 30 minutes of holding steady pressure on the site. °· Your arm or hand becomes pale, cool, tingly, or numb. °This information is not intended to replace advice given to you by your health care provider. Make sure you discuss any questions you have with your health care provider. °Document Released: 03/09/2010 Document Revised: 07/13/2015 Document Reviewed: 08/23/2013 °[employer]vier Interactive Patient Education © 2018 [employer] ° °

## 2017-03-26 ENCOUNTER — Ambulatory Visit (HOSPITAL_COMMUNITY)
Admission: RE | Admit: 2017-03-26 | Discharge: 2017-03-26 | Disposition: A | Discharge status: Home / Self Care | Payer: Commercial Managed Care - PPO | Source: Ambulatory Visit | Attending: Internal Medicine | Admitting: Internal Medicine

## 2017-03-26 VITALS — BP 145/94 | HR 85 | Wt 180.4 lb

## 2017-03-26 DIAGNOSIS — I480 Paroxysmal atrial fibrillation: Secondary | ICD-10-CM | POA: Diagnosis not present

## 2017-03-26 DIAGNOSIS — I5022 Chronic systolic (congestive) heart failure: Secondary | ICD-10-CM | POA: Insufficient documentation

## 2017-03-26 DIAGNOSIS — Z79899 Other long term (current) drug therapy: Secondary | ICD-10-CM | POA: Insufficient documentation

## 2017-03-26 DIAGNOSIS — I110 Hypertensive heart disease with heart failure: Secondary | ICD-10-CM | POA: Insufficient documentation

## 2017-03-26 DIAGNOSIS — F1011 Alcohol abuse, in remission: Secondary | ICD-10-CM | POA: Insufficient documentation

## 2017-03-26 LAB — BASIC METABOLIC PANEL
ANION GAP: 9 (ref 5–15)
BUN: 10 mg/dL (ref 6–20)
CALCIUM: 9.2 mg/dL (ref 8.9–10.3)
CO2: 26 mmol/L (ref 22–32)
Chloride: 105 mmol/L (ref 101–111)
Creatinine, Ser: 0.99 mg/dL (ref 0.61–1.24)
Glucose, Bld: 81 mg/dL (ref 65–99)
Potassium: 4.7 mmol/L (ref 3.5–5.1)
SODIUM: 140 mmol/L (ref 135–145)

## 2017-03-26 MED ORDER — SPIRONOLACTONE 25 MG PO TABS: 12.5000 mg | ORAL_TABLET | Freq: Every day | ORAL | 5 refills | Status: DC

## 2017-03-26 MED ORDER — CARVEDILOL 6.25 MG PO TABS: 6.2500 mg | ORAL_TABLET | Freq: Two times a day (BID) | ORAL | 5 refills | Status: DC

## 2017-03-26 MED ORDER — SACUBITRIL-VALSARTAN 49-51 MG PO TABS: 1.0000 | ORAL_TABLET | Freq: Two times a day (BID) | ORAL | 5 refills | Status: DC

## 2017-03-26 MED ORDER — APIXABAN 5 MG PO TABS: 5.0000 mg | ORAL_TABLET | Freq: Two times a day (BID) | ORAL | 5 refills | Status: DC

## 2017-03-26 NOTE — Progress Notes (Signed)
HF MD: [facility]  HPI:  42 yo African American M with history of HTN, paroxysmal atrial flutter, and cardiomyopathy presents for followup of CHF.   Patient had history of untreated hypertension (diagnosed summer 2018) and hyperlipidemia.  He was admitted in 12/18 with epigastric pain, orthopnea, and dyspnea that had gone on for several days.  He did not free palpitations.  He was noted to be in atrial flutter with RVR and in CHF.  Prior to this episode, he had no exertional symptoms. Echo was done, showing EF 20-25% with moderately decreased RV systolic function.  Of note, prior to admission he was drinking up to 12 beers/day.  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  In the hospital, he was diuresed and cardioverted.  Today, he remains in NSR.   He returns today for pharmacist-led HF medication titration. At last HF clinic visit on 03/11/17, he was started on spironolactone 12.5 mg daily and carvedilol was increased to 6.25 mg BID. He is doing well symptomatically. No palpitations, exertional dyspnea, orthopnea/PND or chest pain. He reports better compliance with his medications now.     . Shortness of breath/dyspnea on exertion? no  . Orthopnea/PND? no . Edema? no . Lightheadedness/dizziness? no . Daily weights at home? No - sometimes weighs himself at work (usually 185-190 lb)  . Blood pressure/heart rate monitoring at home? no . Following low-sodium/fluid-restricted diet? No - ate [employer] today and drinks soda regularly   HF Medications: Carvedilol 6.25 mg PO BID Entresto 24-26 mg PO BID Spironolactone 12.5 mg PO daily   Has the patient been experiencing any side effects to the medications prescribed?  no  Does the patient have any problems obtaining medications due to transportation or finances?   No - [employer]   Understanding of regimen: good Understanding of indications: good Potential of compliance: good Patient understands to avoid  NSAIDs. Patient understands to avoid decongestants.    Pertinent Lab Values: . 03/26/17: Serum creatinine 0.99, BUN 10, Potassium 4.7, Sodium 140  Vital Signs: . Weight: 180.4 lb (dry weight: 180 lb) . Blood pressure: 145/94 mmHg  . Heart rate: 85 bpm   Assessment: 1. 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 NYHA class Isymptoms.  - Volume status stable   - Increase Entresto to 49-51 mg BID since will have greatest effect on BP -- monitor closely with borderline elevated K  - Continue current carvedilol 6.25 mg BID and spironolactone 12.5 mg daily  - Discussed the potential to add Bidil in the future with morbidity/mortality benefits in African American patients  - Basic disease state pathophysiology, medication indication, mechanism and side effects reviewed at length with patient and he verbalized understanding 2. HTN: BP borderline elevated.  Increasing Coreg and adding spironolactone.  3. ETOH abuse: Patient has quit drinking.   4. Atrial flutter: Paroxysmal.  I was concerned for tachy-mediated cardiomyopathy, but EF not significantly improved today after > 1 month in NSR.  Given decompensation with atrial flutter, I think that an atrial flutter ablation would be very reasonable here.  - He needs to see EP to discuss ablation.   - Continue apixaban.   Plan: 1) Medication changes: Based on clinical presentation, vital signs and recent labs will increase Entresto to 49-51 mg BID 2) Labs: BMET today  3) Follow-up: Dr. Aundra Dubin on 04/11/17   Ruta Hinds. Velva Harman, PharmD, BCPS, CPP Clinical Pharmacist Pager: [phone] Phone: [phone] 03/26/2017 2:51 PM

## 2017-03-26 NOTE — Patient Instructions (Addendum)
It was great to meet you today!  Please INCREASE your Entresto to 49-51 mg TWICE DAILY. You may take #2 tablets of your current Entresto 24-26 mg TWICE DAILY until you pick up the higher dose.   Blood work today. We will call you with any changes.   Please keep your appointment with Dr. Aundra Dubin on 04/11/17.

## 2017-03-27 ENCOUNTER — Institutional Professional Consult (permissible substitution): Payer: Commercial Managed Care - PPO | Admitting: Internal Medicine

## 2017-04-11 ENCOUNTER — Ambulatory Visit (HOSPITAL_COMMUNITY)
Admission: RE | Admit: 2017-04-11 | Discharge: 2017-04-11 | Disposition: A | Discharge status: Home / Self Care | Payer: Commercial Managed Care - PPO | Source: Ambulatory Visit | Attending: Cardiology | Admitting: Cardiology

## 2017-04-11 ENCOUNTER — Encounter (HOSPITAL_COMMUNITY): Payer: Self-pay | Admitting: Cardiology

## 2017-04-11 VITALS — BP 138/92 | HR 78 | Wt 185.0 lb

## 2017-04-11 DIAGNOSIS — Z7901 Long term (current) use of anticoagulants: Secondary | ICD-10-CM | POA: Insufficient documentation

## 2017-04-11 DIAGNOSIS — I5022 Chronic systolic (congestive) heart failure: Secondary | ICD-10-CM

## 2017-04-11 DIAGNOSIS — I483 Typical atrial flutter: Secondary | ICD-10-CM | POA: Diagnosis not present

## 2017-04-11 DIAGNOSIS — I429 Cardiomyopathy, unspecified: Secondary | ICD-10-CM | POA: Insufficient documentation

## 2017-04-11 DIAGNOSIS — E785 Hyperlipidemia, unspecified: Secondary | ICD-10-CM | POA: Diagnosis not present

## 2017-04-11 DIAGNOSIS — I110 Hypertensive heart disease with heart failure: Secondary | ICD-10-CM | POA: Diagnosis not present

## 2017-04-11 DIAGNOSIS — I428 Other cardiomyopathies: Secondary | ICD-10-CM | POA: Diagnosis not present

## 2017-04-11 DIAGNOSIS — I5021 Acute systolic (congestive) heart failure: Secondary | ICD-10-CM

## 2017-04-11 DIAGNOSIS — I4892 Unspecified atrial flutter: Secondary | ICD-10-CM | POA: Diagnosis not present

## 2017-04-11 DIAGNOSIS — Z79899 Other long term (current) drug therapy: Secondary | ICD-10-CM | POA: Insufficient documentation

## 2017-04-11 DIAGNOSIS — Z87891 Personal history of nicotine dependence: Secondary | ICD-10-CM | POA: Insufficient documentation

## 2017-04-11 LAB — BASIC METABOLIC PANEL
Anion gap: 9 (ref 5–15)
BUN: 9 mg/dL (ref 6–20)
CHLORIDE: 104 mmol/L (ref 101–111)
CO2: 25 mmol/L (ref 22–32)
CREATININE: 1.04 mg/dL (ref 0.61–1.24)
Calcium: 8.9 mg/dL (ref 8.9–10.3)
GFR calc Af Amer: >60 mL/min (ref >60)
GFR calc non Af Amer: >60 mL/min (ref >60)
Glucose, Bld: 97 mg/dL (ref 65–99)
POTASSIUM: 4.5 mmol/L (ref 3.5–5.1)
Sodium: 138 mmol/L (ref 135–145)

## 2017-04-11 MED ORDER — SPIRONOLACTONE 25 MG PO TABS: 12.5000 mg | ORAL_TABLET | Freq: Every day | ORAL | 5 refills | Status: DC

## 2017-04-11 MED ORDER — SPIRONOLACTONE 25 MG PO TABS: 25.0000 mg | ORAL_TABLET | Freq: Every day | ORAL | 3 refills | Status: DC

## 2017-04-11 MED ORDER — CARVEDILOL 6.25 MG PO TABS: 6.2500 mg | ORAL_TABLET | Freq: Two times a day (BID) | ORAL | 5 refills | Status: DC

## 2017-04-11 MED ORDER — CARVEDILOL 12.5 MG PO TABS: 12.5000 mg | ORAL_TABLET | Freq: Two times a day (BID) | ORAL | 3 refills | Status: DC

## 2017-04-11 NOTE — Patient Instructions (Addendum)
Pick Up Entresto   Increase Carvedilol 9.375 mg (2 tabs), twice a day for 5 days---  -If tolerating increase 12.5 mg (1 tab), twice a day new Rx given  Increase Spironolactone 25 mg (1 tab) daily   Your physician has requested that you have a cardiac MRI. Cardiac MRI uses a computer to create images of your heart as its beating, producing both still and moving pictures of your heart and major blood vessels. For further information please visit [email or web address] Please follow the instruction sheet given to you today for more information.  Labs drawn today (if we do not call you, then your lab work was stable)   Your physician recommends that you return for lab work in: 2 weeks   Your physician recommends that you schedule a follow-up appointment in: 6 weeks with Dr. Aundra Dubin

## 2017-04-13 NOTE — Progress Notes (Signed)
PCP: Dr. Nani Ravens Cardiology: Dr. Aundra Dubin  42 yo with history of HTN, paroxysmal atrial flutter, and cardiomyopathy presents for followup of CHF.   Patient had history of untreated hypertension (diagnosed summer 2018) and hyperlipidemia.  He was admitted in 12/18 with epigastric pain, orthopnea, and dyspnea that had gone on for several days.  He did not free palpitations.  He was noted to be in atrial flutter with RVR and in CHF.  Prior to this episode, he had no exertional symptoms. Echo was done, showing EF 20-25% with moderately decreased RV systolic function.  Of note, prior to admission he was drinking up to 12 beers/day.  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  In the hospital, he was diuresed and cardioverted.  Today, he remains in NSR.   He continues to do well symptomatically. He is no longer drinking ETOH or smoking. LHC/RHC was done in 1/19 with no coronary disease and normal filling pressures.  No significant exertional dyspnea or chest pain.  No lightheadedness.   Labs (10/18): HIV negative, normal Fe studies.  Labs (12/18): K 4, creatinine 1.35, hgb 14.7, decreased TSH, normal free T3 and free T4 Labs (1/19): K 4.3, creatinine 1.0 Labs (2/19): K 4.7, creatinine 0.99  PMH: 1. HTN 2. Hyperlipidemia 3. ETOH abuse 4. Atrial flutter: Paroxysmal.  1st noted in 12/18.  - DCCV 12/18.  5. Chronic systolic CHF: Uncertain etiology.  HIV negative 10/18.  HTN, ETOH abuse could play a role.  Also consider tachy-mediated CMP in setting of atrial flutter with RVR.   - Echo (12/18): EF 20-25%, mild LV dilation, mild LVH, mild-moderate MR, moderately decreased RV systolic function, PASP 44 mmHg.  - TEE (12/18): EF 10-15%, moderate LV dilation, mild-moderate MR, moderately decreased RV systolic function.  - Echo (1/19): EF 25-30%, diffuse hypokinesis, moderate diastolic dysfunction, normal RV size and systolic function.  - LHC/RHC (1/19): No significant CAD, EF 30%; mean RA  6, PA 24/10, mean PCWP 9, CI 2.74  SH: Lives in [place], married, works as Therapist, music.  Drank 12 beers/night prior to 12/18 admission, now has quit.  Quit smoking in 12/18.   FH: Uncle with pacemaker, aunt with MI.  Parents/siblings no cardiac history.   ROS: All systems reviewed and negative except as per HPI.   Current Outpatient Medications  Medication Sig Dispense Refill  . acetaminophen (TYLENOL) 500 MG tablet Take 1 tablet (500 mg total) by mouth every 6 (six) hours as needed for mild pain (or Fever >/= 101). 1 tablet 0  . apixaban (ELIQUIS) 5 MG TABS tablet Take 1 tablet (5 mg total) by mouth 2 (two) times daily. 60 tablet 5  . carvedilol (COREG) 12.5 MG tablet Take 1 tablet (12.5 mg total) by mouth 2 (two) times daily with a meal. 60 tablet 3  . hydrOXYzine (ATARAX/VISTARIL) 50 MG tablet Take 1-2 tablets (50-100 mg total) by mouth 3 (three) times daily as needed for anxiety. 90 tablet 1  . sacubitril-valsartan (ENTRESTO) 49-51 MG Take 1 tablet by mouth 2 (two) times daily. 60 tablet 5  . spironolactone (ALDACTONE) 25 MG tablet Take 1 tablet (25 mg total) by mouth daily. 30 tablet 3   No current facility-administered medications for this encounter.    BP (!) 138/92 (BP Location: Left Arm, Patient Position: Sitting, Cuff Size: Normal)   Pulse 78   Wt 185 lb (83.9 kg)   SpO2 99%   BMI 25.09 kg/m  General: NAD Neck: No JVD,  no thyromegaly or thyroid nodule.  Lungs: Clear to auscultation bilaterally with normal respiratory effort. CV: Nondisplaced PMI.  Heart regular S1/S2, no S3/S4, no murmur.  No peripheral edema.  No carotid bruit.  Normal pedal pulses.  Abdomen: Soft, nontender, no hepatosplenomegaly, no distention.  Skin: Intact without lesions or rashes.  Neurologic: Alert and oriented x 3.  Psych: Normal affect. Extremities: No clubbing or cyanosis.  HEENT: Normal.   Assessment/Plan: 1. Chronic systolic CHF: Echo in 40/97 with EF 20-25%, moderately decreased RV  systolic function.   Echo (1/19) with EF 25-30%. Possible causes are tachycardia-mediated CMP (in atrial flutter with RVR 12/18 admission), long-standing poorly controlled HTN, viral myocarditis, and heavy ETOH.  HIV negative, Fe studies negative.  LHC/RHC in 1/19 showed no CAD, preserved cardiac output, and normal filling pressures.  On exam today, he is euvolemic.  NYHA class I symptoms.  With lack of improvement in EF on 1/19 echo, tachycardia-mediated CMP seems less likely.  - I am going to arrange for a cardiac MRI to assess for infiltrative disease.   - Continue current Entresto.  - Increase Coreg 9.375 mg bid x 5 days, then 12.5 mg bid after that.  - Increase spironolactone to 25 mg daily with BMET today and again in 2 wks.  - Echo in 6/19 for ICD (at 6 months post diagnosis) => would not be CRT candidate with narrow QRS.  2. HTN: BP borderline elevated.  Increasing Coreg and increasing spironolactone.  3. ETOH abuse: Patient has quit drinking.   4. Atrial flutter: Paroxysmal.  I was concerned for tachy-mediated cardiomyopathy, but EF not significantly improved today after > 1 month in NSR.  Given decompensation with atrial flutter, I think that an atrial flutter ablation would be very reasonable here.  - I recommended that he see EP but he does not want to do this yet.   - Continue apixaban.   Followup in 6 wks.   Loralie Champagne 04/13/2017

## 2017-04-14 ENCOUNTER — Telehealth (HOSPITAL_COMMUNITY): Payer: Self-pay

## 2017-04-14 NOTE — Telephone Encounter (Signed)
Pt refused EP appointment and has not answered phone for referral.    Hey!   I had a referral for this guy for EP to discuss Afib Ablation. I called 03-03-17 left a message, 03-07-17 left a message. Called 03-18-17 as he was scheduled 03-27-17 with Lovena Le who does not due Afib Ablations, and left a message for him to RS appt. Called again 04-02-17 and left another message. No response.    Thanks, [employer]

## 2017-04-25 ENCOUNTER — Ambulatory Visit (HOSPITAL_COMMUNITY)
Admission: RE | Admit: 2017-04-25 | Discharge: 2017-04-25 | Disposition: A | Discharge status: Home / Self Care | Payer: Commercial Managed Care - PPO | Source: Ambulatory Visit | Attending: Cardiology | Admitting: Cardiology

## 2017-04-25 DIAGNOSIS — I5022 Chronic systolic (congestive) heart failure: Secondary | ICD-10-CM | POA: Diagnosis not present

## 2017-04-25 LAB — BASIC METABOLIC PANEL
Anion gap: 10 (ref 5–15)
BUN: 9 mg/dL (ref 6–20)
CHLORIDE: 103 mmol/L (ref 101–111)
CO2: 27 mmol/L (ref 22–32)
Calcium: 9.1 mg/dL (ref 8.9–10.3)
Creatinine, Ser: 1.12 mg/dL (ref 0.61–1.24)
GFR calc Af Amer: >60 mL/min (ref >60)
GFR calc non Af Amer: >60 mL/min (ref >60)
GLUCOSE: 116 mg/dL — AB (ref 65–99)
POTASSIUM: 4.1 mmol/L (ref 3.5–5.1)
Sodium: 140 mmol/L (ref 135–145)

## 2017-04-29 DIAGNOSIS — Z760 Encounter for issue of repeat prescription: Secondary | ICD-10-CM | POA: Diagnosis not present

## 2017-05-26 ENCOUNTER — Encounter (HOSPITAL_COMMUNITY): Payer: Commercial Managed Care - PPO | Admitting: Cardiology

## 2017-07-17 ENCOUNTER — Encounter (HOSPITAL_COMMUNITY): Payer: Self-pay | Admitting: Cardiology

## 2017-07-17 ENCOUNTER — Institutional Professional Consult (permissible substitution): Payer: Commercial Managed Care - PPO | Admitting: Internal Medicine

## 2017-07-17 ENCOUNTER — Ambulatory Visit (HOSPITAL_COMMUNITY)
Admission: RE | Admit: 2017-07-17 | Discharge: 2017-07-17 | Disposition: A | Discharge status: Home / Self Care | Payer: Commercial Managed Care - PPO | Source: Ambulatory Visit | Attending: Cardiology | Admitting: Cardiology

## 2017-07-17 VITALS — BP 138/82 | HR 76 | Wt 195.0 lb

## 2017-07-17 DIAGNOSIS — I429 Cardiomyopathy, unspecified: Secondary | ICD-10-CM | POA: Insufficient documentation

## 2017-07-17 DIAGNOSIS — I110 Hypertensive heart disease with heart failure: Secondary | ICD-10-CM | POA: Diagnosis not present

## 2017-07-17 DIAGNOSIS — Z7901 Long term (current) use of anticoagulants: Secondary | ICD-10-CM | POA: Insufficient documentation

## 2017-07-17 DIAGNOSIS — I5022 Chronic systolic (congestive) heart failure: Secondary | ICD-10-CM | POA: Diagnosis not present

## 2017-07-17 DIAGNOSIS — Z79899 Other long term (current) drug therapy: Secondary | ICD-10-CM | POA: Insufficient documentation

## 2017-07-17 DIAGNOSIS — E785 Hyperlipidemia, unspecified: Secondary | ICD-10-CM | POA: Diagnosis not present

## 2017-07-17 DIAGNOSIS — I4892 Unspecified atrial flutter: Secondary | ICD-10-CM | POA: Insufficient documentation

## 2017-07-17 DIAGNOSIS — Z87891 Personal history of nicotine dependence: Secondary | ICD-10-CM | POA: Insufficient documentation

## 2017-07-17 DIAGNOSIS — I483 Typical atrial flutter: Secondary | ICD-10-CM | POA: Diagnosis not present

## 2017-07-17 DIAGNOSIS — I4439 Other atrioventricular block: Secondary | ICD-10-CM | POA: Diagnosis not present

## 2017-07-17 LAB — BASIC METABOLIC PANEL
Anion gap: 10 (ref 5–15)
BUN: 12 mg/dL (ref 6–20)
CHLORIDE: 107 mmol/L (ref 101–111)
CO2: 23 mmol/L (ref 22–32)
Calcium: 9.1 mg/dL (ref 8.9–10.3)
Creatinine, Ser: 1.4 mg/dL — ABNORMAL HIGH (ref 0.61–1.24)
GFR calc Af Amer: >60 mL/min (ref >60)
GFR calc non Af Amer: >60 mL/min (ref >60)
Glucose, Bld: 93 mg/dL (ref 65–99)
POTASSIUM: 4.5 mmol/L (ref 3.5–5.1)
SODIUM: 140 mmol/L (ref 135–145)

## 2017-07-17 LAB — CBC
HCT: 45.6 % (ref 39.0–52.0)
Hemoglobin: 14.6 g/dL (ref 13.0–17.0)
MCH: 29.1 pg (ref 26.0–34.0)
MCHC: 32 g/dL (ref 30.0–36.0)
MCV: 90.8 fL (ref 78.0–100.0)
Platelets: 269 K/uL (ref 150–400)
RBC: 5.02 MIL/uL (ref 4.22–5.81)
RDW: 13.7 % (ref 11.5–15.5)
WBC: 6.1 K/uL (ref 4.0–10.5)

## 2017-07-17 LAB — MAGNESIUM: Magnesium: 2 mg/dL (ref 1.7–2.4)

## 2017-07-17 MED ORDER — SPIRONOLACTONE 25 MG PO TABS: 25.0000 mg | ORAL_TABLET | Freq: Every day | ORAL | 3 refills | Status: DC

## 2017-07-17 MED ORDER — SACUBITRIL-VALSARTAN 97-103 MG PO TABS: 1.0000 | ORAL_TABLET | Freq: Two times a day (BID) | ORAL | 3 refills | Status: DC

## 2017-07-17 NOTE — Patient Instructions (Addendum)
Increase Entresto 97/103 mg   Labs drawn today (if we do not call you, then your lab work was stable)   Your physician recommends that you return for lab work in: 10 days   Your physician has requested that you have a cardiac MRI. Cardiac MRI uses a computer to create images of your heart as its beating, producing both still and moving pictures of your heart and major blood vessels. For further information please visit [email or web address] Please follow the instruction sheet given to you today for more information.  Your physician recommends that you schedule a follow-up appointment in: 2 months with Dr. Aundra Dubin

## 2017-07-18 ENCOUNTER — Telehealth: Payer: Self-pay

## 2017-07-18 NOTE — Telephone Encounter (Signed)
Please arrive at the [facility] main entrance of [facility] at:  9:30 am on 07/21/2017 Do not eat or drink after midnight prior to procedure Do NOT take your ELIQUIS the morning of your procedure You MAY take carvedilol and Entresto the morning of your procedure if you can tolerate on an empty stomach.  If not just hold all morning medications. You will need someone to drive you home at discharge  Left CVM per DPR.

## 2017-07-19 NOTE — Progress Notes (Signed)
PCP: Dr. Nani Ravens Cardiology: Dr. Aundra Dubin  42 yo with history of HTN, paroxysmal atrial flutter, and cardiomyopathy presents for followup of CHF.   Patient had history of untreated hypertension (diagnosed summer 2018) and hyperlipidemia.  He was admitted in 12/18 with epigastric pain, orthopnea, and dyspnea that had gone on for several days.  He did not free palpitations.  He was noted to be in atrial flutter with RVR and in CHF.  Prior to this episode, he had no exertional symptoms. Echo was done, showing EF 20-25% with moderately decreased RV systolic function.  Of note, prior to admission he was drinking up to 12 beers/day.  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  In the hospital, he was diuresed and cardioverted.  LHC/RHC was done in 1/19 with no coronary disease and normal filling pressures.   He continues to do well symptomatically. He is no longer drinking ETOH or smoking.  Occasional dizziness if he stands too fast.  No exertional dyspnea.  No chest pain.  No orthopnea/PND.  He has noted a fluttering in his chest this week, and he was found by ECG to be back in atrial flutter with HR 110s today.    Labs (10/18): HIV negative, normal Fe studies.  Labs (12/18): K 4, creatinine 1.35, hgb 14.7, decreased TSH, normal free T3 and free T4 Labs (1/19): K 4.3, creatinine 1.0 Labs (2/19): K 4.7, creatinine 0.99 Labs (3/19): K 4.1, creatinine 1.12  PMH: 1. HTN 2. Hyperlipidemia 3. ETOH abuse 4. Atrial flutter: Paroxysmal.  1st noted in 12/18.  - DCCV 12/18.  5. Chronic systolic CHF: Uncertain etiology.  HIV negative 10/18.  HTN, ETOH abuse could play a role.  Also consider tachy-mediated CMP in setting of atrial flutter with RVR.   - Echo (12/18): EF 20-25%, mild LV dilation, mild LVH, mild-moderate MR, moderately decreased RV systolic function, PASP 44 mmHg.  - TEE (12/18): EF 10-15%, moderate LV dilation, mild-moderate MR, moderately decreased RV systolic function.  -  Echo (1/19): EF 25-30%, diffuse hypokinesis, moderate diastolic dysfunction, normal RV size and systolic function.  - LHC/RHC (1/19): No significant CAD, EF 30%; mean RA 6, PA 24/10, mean PCWP 9, CI 2.74  SH: Lives in [place], married, works as Therapist, music.  Drank 12 beers/night prior to 12/18 admission, now has quit.  Quit smoking in 12/18.   FH: Uncle with pacemaker, aunt with MI.  Parents/siblings no cardiac history.   ROS: All systems reviewed and negative except as per HPI.   Current Outpatient Medications  Medication Sig Dispense Refill  . apixaban (ELIQUIS) 5 MG TABS tablet Take 1 tablet (5 mg total) by mouth 2 (two) times daily. 60 tablet 5  . carvedilol (COREG) 12.5 MG tablet Take 1 tablet (12.5 mg total) by mouth 2 (two) times daily with a meal. (Patient not taking: Reported on 07/18/2017) 60 tablet 3  . spironolactone (ALDACTONE) 25 MG tablet Take 1 tablet (25 mg total) by mouth at bedtime. 30 tablet 3  . acetaminophen (TYLENOL) 500 MG tablet Take 1 tablet (500 mg total) by mouth every 6 (six) hours as needed for mild pain (or Fever >/= 101). (Patient not taking: Reported on 07/18/2017) 1 tablet 0  . hydrOXYzine (ATARAX/VISTARIL) 50 MG tablet Take 1-2 tablets (50-100 mg total) by mouth 3 (three) times daily as needed for anxiety. (Patient not taking: Reported on 07/17/2017) 90 tablet 1  . sacubitril-valsartan (ENTRESTO) 97-103 MG Take 1 tablet by mouth 2 (two)  times daily. 60 tablet 3   No current facility-administered medications for this encounter.    BP 138/82   Pulse 76   Wt 195 lb (88.5 kg)   SpO2 98%   BMI 26.45 kg/m  General: NAD Neck: No JVD, no thyromegaly or thyroid nodule.  Lungs: Clear to auscultation bilaterally with normal respiratory effort. CV: Nondisplaced PMI.  Heart mildly tachy, irregular S1/S2, no S3/S4, no murmur.  No peripheral edema.  No carotid bruit.  Normal pedal pulses.  Abdomen: Soft, nontender, no hepatosplenomegaly, no distention.  Skin:  Intact without lesions or rashes.  Neurologic: Alert and oriented x 3.  Psych: Normal affect. Extremities: No clubbing or cyanosis.  HEENT: Normal.   Assessment/Plan: 1. Chronic systolic CHF: Echo in 94/70 with EF 20-25%, moderately decreased RV systolic function.   Echo (1/19) with EF 25-30%. Possible causes are tachycardia-mediated CMP (in atrial flutter with RVR 12/18 admission), long-standing poorly controlled HTN, viral myocarditis, and heavy ETOH.  HIV negative, Fe studies negative.  LHC/RHC in 1/19 showed no CAD, preserved cardiac output, and normal filling pressures.  With lack of improvement in EF on 1/19 echo, tachycardia-mediated CMP seems less likely. On exam today, he is euvolemic.  NYHA class I symptoms.  However, he is noted to be back in atrial flutter.  - Cardiac MRI ordered, need to assess for infiltrative disease/prior myocarditis.  If EF < 35%, will need to discusse ICD (not QRT candidate with narrow QRS). - Increase Entresto to 97/103 bid.  BMET today and in 10 days.  - Continue Coreg 12.5 mg bid.   - Continue spironolactone 25 daily.   2. HTN: BP controlled.   3. ETOH abuse: Patient has quit drinking.   4. Atrial flutter: Paroxysmal.  I was concerned for tachy-mediated cardiomyopathy, but EF not significantly improved in 1/19 after > 1 month in NSR.  He is back in atrial flutter (typical) today with mild RVR.  - Given decompensation in setting of atrial flutter in 12/18, I recommended that he go ahead and have an atrial flutter ablation. He agreed to this, so I had him seen by Dr. Lovena Le in the office today and he is scheduled for Monday.  - Continue apixaban.   Followup in 2 months  Loralie Champagne 07/19/2017

## 2017-07-21 ENCOUNTER — Ambulatory Visit (HOSPITAL_COMMUNITY)
Admission: RE | Admit: 2017-07-21 | Discharge: 2017-07-21 | Disposition: A | Discharge status: Home / Self Care | Payer: Commercial Managed Care - PPO | Source: Ambulatory Visit | Attending: Internal Medicine | Admitting: Internal Medicine

## 2017-07-21 ENCOUNTER — Encounter (HOSPITAL_COMMUNITY)
Admission: RE | Disposition: A | Discharge status: Home / Self Care | Payer: Self-pay | Source: Ambulatory Visit | Attending: Internal Medicine

## 2017-07-21 DIAGNOSIS — I5022 Chronic systolic (congestive) heart failure: Secondary | ICD-10-CM

## 2017-07-21 DIAGNOSIS — E785 Hyperlipidemia, unspecified: Secondary | ICD-10-CM | POA: Diagnosis not present

## 2017-07-21 DIAGNOSIS — I483 Typical atrial flutter: Secondary | ICD-10-CM | POA: Diagnosis not present

## 2017-07-21 DIAGNOSIS — I110 Hypertensive heart disease with heart failure: Secondary | ICD-10-CM | POA: Insufficient documentation

## 2017-07-21 DIAGNOSIS — I4892 Unspecified atrial flutter: Secondary | ICD-10-CM | POA: Insufficient documentation

## 2017-07-21 DIAGNOSIS — Z7901 Long term (current) use of anticoagulants: Secondary | ICD-10-CM | POA: Diagnosis not present

## 2017-07-21 DIAGNOSIS — Z8249 Family history of ischemic heart disease and other diseases of the circulatory system: Secondary | ICD-10-CM | POA: Diagnosis not present

## 2017-07-21 DIAGNOSIS — Z5309 Procedure and treatment not carried out because of other contraindication: Secondary | ICD-10-CM | POA: Diagnosis not present

## 2017-07-21 DIAGNOSIS — I5042 Chronic combined systolic (congestive) and diastolic (congestive) heart failure: Secondary | ICD-10-CM | POA: Insufficient documentation

## 2017-07-21 DIAGNOSIS — Z87891 Personal history of nicotine dependence: Secondary | ICD-10-CM | POA: Insufficient documentation

## 2017-07-21 SURGERY — A-FLUTTER ABLATION

## 2017-07-21 MED ORDER — SODIUM CHLORIDE 0.9 % IV SOLN: INTRAVENOUS | Status: DC

## 2017-07-21 NOTE — H&P (Signed)
Cardiology Consultation:   Patient ID: Devin [name].; [id number]; [birth year]   Admit date: 07/21/2017 Date of Consult: 07/21/2017  Primary Care Provider: Shelda Pal, DO Primary Cardiologist: Jenkins Rouge, MD  Primary Electrophysiologist:  new   Patient Profile:   Devin [name]. is a 42 y.o. male with a hx of CHF who is being seen today for the evaluation of atrial flutter at the request of Dr. Aundra Dubin.  History of Present Illness:   Mr. Hellstrom is a pleasant 42 yo man with chronic systolic/diastolic heart failure who presents today for consideration for catheter ablation of Atrial flutter. He is a pleasant 42 yo man who has had worsening CHF, and found to be in atrial flutter with a RVR. He has been treated with rate control and was pending EP study and ablation. He was found to have not been taking the Eliquis as prescribed.   Past Medical History:  Diagnosis Date  . CHF (congestive heart failure) ([place])   . Hyperlipidemia   . Hypertension     Past Surgical History:  Procedure Laterality Date  . CARDIOVERSION N/A 01/27/2017   Procedure: CARDIOVERSION;  Surgeon: Sanda Klein, MD;  Location: [facility];  Service: Cardiovascular;  Laterality: N/A;  . NO PAST SURGERIES    . RIGHT/LEFT HEART CATH AND CORONARY ANGIOGRAPHY N/A 03/21/2017   Procedure: RIGHT/LEFT HEART CATH AND CORONARY ANGIOGRAPHY;  Surgeon: Larey Dresser, MD;  Location: [facility];  Service: Cardiovascular;  Laterality: N/A;  . TEE WITHOUT CARDIOVERSION N/A 01/27/2017   Procedure: TRANSESOPHAGEAL ECHOCARDIOGRAM (TEE);  Surgeon: Sanda Klein, MD;  Location: [facility] ENDOSCOPY;  Service: Cardiovascular;  Laterality: N/A;     Home Medications:  Prior to Admission medications   Medication Sig Start Date End Date Taking? Authorizing Provider  apixaban (ELIQUIS) 5 MG TABS tablet Take 1 tablet (5 mg total) by mouth 2 (two) times daily. 03/26/17  Yes Larey Dresser, MD  sacubitril-valsartan  (ENTRESTO) 97-103 MG Take 1 tablet by mouth 2 (two) times daily. 07/17/17  Yes Larey Dresser, MD  spironolactone (ALDACTONE) 25 MG tablet Take 1 tablet (25 mg total) by mouth at bedtime. 07/17/17 10/15/17 Yes Larey Dresser, MD  acetaminophen (TYLENOL) 500 MG tablet Take 1 tablet (500 mg total) by mouth every 6 (six) hours as needed for mild pain (or Fever >/= 101). Patient not taking: Reported on 07/18/2017 01/28/17   Cherene Altes, MD  carvedilol (COREG) 12.5 MG tablet Take 1 tablet (12.5 mg total) by mouth 2 (two) times daily with a meal. Patient not taking: Reported on 07/18/2017 04/11/17   Larey Dresser, MD  hydrOXYzine (ATARAX/VISTARIL) 50 MG tablet Take 1-2 tablets (50-100 mg total) by mouth 3 (three) times daily as needed for anxiety. Patient not taking: Reported on 07/17/2017 01/29/17   Shelda Pal, DO    Inpatient Medications: Scheduled Meds:  Continuous Infusions:  PRN Meds:   Allergies:   No Known Allergies  Social History:   Social History   Socioeconomic History  . Marital status: Married    Spouse name: Not on file  . Number of children: Not on file  . Years of education: Not on file  . Highest education level: Not on file  Occupational History  . Not on file  Social Needs  . Financial resource strain: Not on file  . Food insecurity:    Worry: Not on file    Inability: Not on file  . Transportation needs:    Medical: Not  on file    Non-medical: Not on file  Tobacco Use  . Smoking status: Former Smoker    Packs/day: 0.40    Years: 22.00    Pack years: 8.80    Types: Cigarettes  . Smokeless tobacco: Never Used  . Tobacco comment: quit while in hosp 01/25/17  Substance and Sexual Activity  . Alcohol use: No    Frequency: Never    Comment: quit 01/25/17  . Drug use: No  . Sexual activity: Never    Partners: Female  Lifestyle  . Physical activity:    Days per week: Not on file    Minutes per session: Not on file  . Stress: Not on  file  Relationships  . Social connections:    Talks on phone: Not on file    Gets together: Not on file    Attends religious service: Not on file    Active member of club or organization: Not on file    Attends meetings of clubs or organizations: Not on file    Relationship status: Not on file  . Intimate partner violence:    Fear of current or ex partner: Not on file    Emotionally abused: Not on file    Physically abused: Not on file    Forced sexual activity: Not on file  Other Topics Concern  . Not on file  Social History Narrative  . Not on file    Family History:    Family History  Problem Relation Age of Onset  . Diabetes Mother   . Hypertension Mother   . Hypertension Father   . Hypertension Sister   . Asthma Son   . Cancer Maternal Grandfather   . Cancer Paternal Grandmother        cancer  . Heart attack Paternal Grandfather      ROS:  Please see the history of present illness.   All other ROS reviewed and negative.     Physical Exam/Data:   Vitals:   07/21/17 0932  BP: 124/86  Pulse: 86  Temp: 98.4 F (36.9 C)  TempSrc: Oral  SpO2: 99%  Weight: 195 lb (88.5 kg)  Height: 6' (1.829 m)   No intake or output data in the 24 hours ending 07/21/17 2157 Filed Weights   07/21/17 0932  Weight: 195 lb (88.5 kg)   Body mass index is 26.45 kg/m.  General:  Well nourished, well developed, in no acute distress HEENT: normal Lymph: no adenopathy Neck: 6 cm JVD Endocrine:  No thryomegaly Vascular: No carotid bruits; FA pulses 2+ bilaterally without bruits  Cardiac:  normal S1, S2; IRIRR; no murmur  Lungs:  clear to auscultation bilaterally, no wheezing, rhonchi or rales  Abd: soft, nontender, no hepatomegaly  Ext: no edema Musculoskeletal:  No deformities, BUE and BLE strength normal and equal Skin: warm and dry  Neuro:  CNs 2-12 intact, no focal abnormalities noted Psych:  Normal affect   EKG:  The EKG was personally reviewed and demonstrates:   Atrial flutter with a RVR  Relevant CV Studies: none  Laboratory Data:  Chemistry Recent Labs  Lab 07/17/17 1625  NA 140  K 4.5  CL 107  CO2 23  GLUCOSE 93  BUN 12  CREATININE 1.40*  CALCIUM 9.1  GFRNONAA >60  GFRAA >60  ANIONGAP 10    No results for input(s): PROT, ALBUMIN, AST, ALT, ALKPHOS, BILITOT in the last 168 hours. Hematology Recent Labs  Lab 07/17/17 1625  WBC 6.1  RBC  5.02  HGB 14.6  HCT 45.6  MCV 90.8  MCH 29.1  MCHC 32.0  RDW 13.7  PLT 269   Cardiac EnzymesNo results for input(s): TROPONINI in the last 168 hours. No results for input(s): TROPIPOC in the last 168 hours.  BNPNo results for input(s): BNP, PROBNP in the last 168 hours.  DDimer No results for input(s): DDIMER in the last 168 hours.  Radiology/Studies:  No results found.  Assessment and Plan:   1. Atrial flutter - I have discussed EP study and catheter ablation. Unfortunately he cannot undergo ablation due to his not taking Eliquis as prescribed. I have explained the importance of taking Eliquis twice a day. We will have him return in 3 weeks or if possible after he has undergone a TEE. 2. Chronic systolic CHF - he is currently not fluid overloaded. He will continue his current meds.   Gregg Taylor,M.D.   For questions or updates, please contact [place] Please consult [email or web address] for contact info under Cardiology/STEMI.   Signed, Cristopher Peru, MD  07/21/2017 9:57 PM

## 2017-07-21 NOTE — H&P (View-Only) (Signed)
Cardiology Consultation:   Patient ID: Devin [name].; [id number]; June 04, 1975   Admit date: 07/21/2017 Date of Consult: 07/21/2017  Primary Care Provider: Shelda Pal, DO Primary Cardiologist: Jenkins Rouge, MD  Primary Electrophysiologist:  new   Patient Profile:   Devin Weida. is a 42 y.o. male with a hx of CHF who is being seen today for the evaluation of atrial flutter at the request of Dr. Aundra Dubin.  History of Present Illness:   Devin [name] is a pleasant 42 yo man with chronic systolic/diastolic heart failure who presents today for consideration for catheter ablation of Atrial flutter. He is a pleasant 42 yo man who has had worsening CHF, and found to be in atrial flutter with a RVR. He has been treated with rate control and was pending EP study and ablation. He was found to have not been taking the Eliquis as prescribed.   Past Medical History:  Diagnosis Date  . CHF (congestive heart failure) ([place])   . Hyperlipidemia   . Hypertension     Past Surgical History:  Procedure Laterality Date  . CARDIOVERSION N/A 01/27/2017   Procedure: CARDIOVERSION;  Surgeon: Sanda Klein, MD;  Location: [facility] ENDOS[facility];  Service: Cardiovascular;  Laterality: N/A;  . NO PAST SURGERIES    . RIGHT/LEFT HEART CATH AND CORONARY ANGIOGRAPHY N/A 03/21/2017   Procedure: RIGHT/LEFT HEART CATH AND CORONARY ANGIOGRAPHY;  Surgeon: Larey Dresser, MD;  Location: [facility];  Service: Cardiovascular;  Laterality: N/A;  . TEE WITHOUT CARDIOVERSION N/A 01/27/2017   Procedure: TRANSESOPHAGEAL ECHOCARDIOGRAM (TEE);  Surgeon: Sanda Klein, MD;  Location: [facility] ENDOSCOPY;  Service: Cardiovascular;  Laterality: N/A;     Home Medications:  Prior to Admission medications   Medication Sig Start Date End Date Taking? Authorizing Provider  apixaban (ELIQUIS) 5 MG TABS tablet Take 1 tablet (5 mg total) by mouth 2 (two) times daily. 03/26/17  Yes Larey Dresser, MD  sacubitril-valsartan  (ENTRESTO) 97-103 MG Take 1 tablet by mouth 2 (two) times daily. 07/17/17  Yes Larey Dresser, MD  spironolactone (ALDACTONE) 25 MG tablet Take 1 tablet (25 mg total) by mouth at bedtime. 07/17/17 10/15/17 Yes Larey Dresser, MD  acetaminophen (TYLENOL) 500 MG tablet Take 1 tablet (500 mg total) by mouth every 6 (six) hours as needed for mild pain (or Fever >/= 101). Patient not taking: Reported on 07/18/2017 01/28/17   Cherene Altes, MD  carvedilol (COREG) 12.5 MG tablet Take 1 tablet (12.5 mg total) by mouth 2 (two) times daily with a meal. Patient not taking: Reported on 07/18/2017 04/11/17   Larey Dresser, MD  hydrOXYzine (ATARAX/VISTARIL) 50 MG tablet Take 1-2 tablets (50-100 mg total) by mouth 3 (three) times daily as needed for anxiety. Patient not taking: Reported on 07/17/2017 01/29/17   Shelda Pal, DO    Inpatient Medications: Scheduled Meds:  Continuous Infusions:  PRN Meds:   Allergies:   No Known Allergies  Social History:   Social History   Socioeconomic History  . Marital status: Married    Spouse name: Not on file  . Number of children: Not on file  . Years of education: Not on file  . Highest education level: Not on file  Occupational History  . Not on file  Social Needs  . Financial resource strain: Not on file  . Food insecurity:    Worry: Not on file    Inability: Not on file  . Transportation needs:    Medical: Not  on file    Non-medical: Not on file  Tobacco Use  . Smoking status: Former Smoker    Packs/day: 0.40    Years: 22.00    Pack years: 8.80    Types: Cigarettes  . Smokeless tobacco: Never Used  . Tobacco comment: quit while in hosp 01/25/17  Substance and Sexual Activity  . Alcohol use: No    Frequency: Never    Comment: quit 01/25/17  . Drug use: No  . Sexual activity: Never    Partners: Female  Lifestyle  . Physical activity:    Days per week: Not on file    Minutes per session: Not on file  . Stress: Not on  file  Relationships  . Social connections:    Talks on phone: Not on file    Gets together: Not on file    Attends religious service: Not on file    Active member of club or organization: Not on file    Attends meetings of clubs or organizations: Not on file    Relationship status: Not on file  . Intimate partner violence:    Fear of current or ex partner: Not on file    Emotionally abused: Not on file    Physically abused: Not on file    Forced sexual activity: Not on file  Other Topics Concern  . Not on file  Social History Narrative  . Not on file    Family History:    Family History  Problem Relation Age of Onset  . Diabetes Mother   . Hypertension Mother   . Hypertension Father   . Hypertension Sister   . Asthma Son   . Cancer Maternal Grandfather   . Cancer Paternal Grandmother        cancer  . Heart attack Paternal Grandfather      ROS:  Please see the history of present illness.   All other ROS reviewed and negative.     Physical Exam/Data:   Vitals:   07/21/17 0932  BP: 124/86  Pulse: 86  Temp: 98.4 F (36.9 C)  TempSrc: Oral  SpO2: 99%  Weight: 195 lb (88.5 kg)  Height: 6' (1.829 m)   No intake or output data in the 24 hours ending 07/21/17 2157 Filed Weights   07/21/17 0932  Weight: 195 lb (88.5 kg)   Body mass index is 26.45 kg/m.  General:  Well nourished, well developed, in no acute distress HEENT: normal Lymph: no adenopathy Neck: 6 cm JVD Endocrine:  No thryomegaly Vascular: No carotid bruits; FA pulses 2+ bilaterally without bruits  Cardiac:  normal S1, S2; IRIRR; no murmur  Lungs:  clear to auscultation bilaterally, no wheezing, rhonchi or rales  Abd: soft, nontender, no hepatomegaly  Ext: no edema Musculoskeletal:  No deformities, BUE and BLE strength normal and equal Skin: warm and dry  Neuro:  CNs 2-12 intact, no focal abnormalities noted Psych:  Normal affect   EKG:  The EKG was personally reviewed and demonstrates:   Atrial flutter with a RVR  Relevant CV Studies: none  Laboratory Data:  Chemistry Recent Labs  Lab 07/17/17 1625  NA 140  K 4.5  CL 107  CO2 23  GLUCOSE 93  BUN 12  CREATININE 1.40*  CALCIUM 9.1  GFRNONAA >60  GFRAA >60  ANIONGAP 10    No results for input(s): PROT, ALBUMIN, AST, ALT, ALKPHOS, BILITOT in the last 168 hours. Hematology Recent Labs  Lab 07/17/17 1625  WBC 6.1  RBC  5.02  HGB 14.6  HCT 45.6  MCV 90.8  MCH 29.1  MCHC 32.0  RDW 13.7  PLT 269   Cardiac EnzymesNo results for input(s): TROPONINI in the last 168 hours. No results for input(s): TROPIPOC in the last 168 hours.  BNPNo results for input(s): BNP, PROBNP in the last 168 hours.  DDimer No results for input(s): DDIMER in the last 168 hours.  Radiology/Studies:  No results found.  Assessment and Plan:   1. Atrial flutter - I have discussed EP study and catheter ablation. Unfortunately he cannot undergo ablation due to his not taking Eliquis as prescribed. I have explained the importance of taking Eliquis twice a day. We will have him return in 3 weeks or if possible after he has undergone a TEE. 2. Chronic systolic CHF - he is currently not fluid overloaded. He will continue his current meds.   Jhaden Pizzuto,M.D.   For questions or updates, please contact [place] Please consult [email or web address] for contact info under Cardiology/STEMI.   Signed, Cristopher Peru, MD  07/21/2017 9:57 PM

## 2017-07-22 ENCOUNTER — Telehealth: Payer: Self-pay | Admitting: Internal Medicine

## 2017-07-22 NOTE — Telephone Encounter (Signed)
New Message     Patient needs a doctors note emailed or fax [phone]  email [email or web address]  Needs this for his appointment yesterday 07/21/17

## 2017-07-22 NOTE — Telephone Encounter (Signed)
New message ° ° °Patient calling to schedule ablation °

## 2017-07-22 NOTE — Telephone Encounter (Signed)
Scheduled via MyChart.

## 2017-07-28 ENCOUNTER — Other Ambulatory Visit (HOSPITAL_COMMUNITY): Payer: Commercial Managed Care - PPO

## 2017-08-12 ENCOUNTER — Ambulatory Visit (HOSPITAL_COMMUNITY)
Admission: RE | Admit: 2017-08-12 | Discharge: 2017-08-12 | Disposition: A | Discharge status: Home / Self Care | Payer: Commercial Managed Care - PPO | Source: Ambulatory Visit | Attending: Internal Medicine | Admitting: Internal Medicine

## 2017-08-12 ENCOUNTER — Encounter (HOSPITAL_COMMUNITY)
Admission: RE | Disposition: A | Discharge status: Home / Self Care | Payer: Self-pay | Source: Ambulatory Visit | Attending: Internal Medicine

## 2017-08-12 DIAGNOSIS — Z87891 Personal history of nicotine dependence: Secondary | ICD-10-CM | POA: Insufficient documentation

## 2017-08-12 DIAGNOSIS — I110 Hypertensive heart disease with heart failure: Secondary | ICD-10-CM | POA: Diagnosis not present

## 2017-08-12 DIAGNOSIS — I5042 Chronic combined systolic (congestive) and diastolic (congestive) heart failure: Secondary | ICD-10-CM | POA: Insufficient documentation

## 2017-08-12 DIAGNOSIS — Z8249 Family history of ischemic heart disease and other diseases of the circulatory system: Secondary | ICD-10-CM | POA: Diagnosis not present

## 2017-08-12 DIAGNOSIS — E785 Hyperlipidemia, unspecified: Secondary | ICD-10-CM | POA: Diagnosis not present

## 2017-08-12 DIAGNOSIS — I483 Typical atrial flutter: Secondary | ICD-10-CM | POA: Insufficient documentation

## 2017-08-12 DIAGNOSIS — Z7901 Long term (current) use of anticoagulants: Secondary | ICD-10-CM | POA: Diagnosis not present

## 2017-08-12 HISTORY — PX: A-FLUTTER ABLATION: EP1230

## 2017-08-12 SURGERY — A-FLUTTER ABLATION

## 2017-08-12 MED ORDER — FENTANYL CITRATE (PF) 100 MCG/2ML IJ SOLN
INTRAMUSCULAR | Status: DC | PRN
  Administered 2017-08-12 (×3): 25 ug via INTRAVENOUS
  Administered 2017-08-12: 12.5 ug via INTRAVENOUS
  Administered 2017-08-12: 25 ug via INTRAVENOUS

## 2017-08-12 MED ORDER — BUPIVACAINE HCL (PF) 0.25 % IJ SOLN
INTRAMUSCULAR | Status: AC
  Filled 2017-08-12: qty 60

## 2017-08-12 MED ORDER — MIDAZOLAM HCL 5 MG/5ML IJ SOLN
INTRAMUSCULAR | Status: DC | PRN
  Administered 2017-08-12: 2 mg via INTRAVENOUS
  Administered 2017-08-12 (×3): 1 mg via INTRAVENOUS
  Administered 2017-08-12: 2 mg via INTRAVENOUS
  Administered 2017-08-12: 1 mg via INTRAVENOUS

## 2017-08-12 MED ORDER — MIDAZOLAM HCL 5 MG/5ML IJ SOLN
INTRAMUSCULAR | Status: AC
  Filled 2017-08-12: qty 5

## 2017-08-12 MED ORDER — FENTANYL CITRATE (PF) 100 MCG/2ML IJ SOLN
INTRAMUSCULAR | Status: AC
  Filled 2017-08-12: qty 2

## 2017-08-12 MED ORDER — ACETAMINOPHEN 325 MG PO TABS: 650.0000 mg | ORAL_TABLET | ORAL | Status: DC | PRN

## 2017-08-12 MED ORDER — BUPIVACAINE HCL (PF) 0.25 % IJ SOLN
INTRAMUSCULAR | Status: DC | PRN
  Administered 2017-08-12: 45 mL

## 2017-08-12 MED ORDER — SODIUM CHLORIDE 0.9% FLUSH: 3.0000 mL | INTRAVENOUS | Status: DC | PRN

## 2017-08-12 MED ORDER — HEPARIN (PORCINE) IN NACL 1000-0.9 UT/500ML-% IV SOLN
INTRAVENOUS | Status: AC
  Filled 2017-08-12: qty 500

## 2017-08-12 MED ORDER — SODIUM CHLORIDE 0.9% FLUSH: 3.0000 mL | Freq: Two times a day (BID) | INTRAVENOUS | Status: DC

## 2017-08-12 MED ORDER — SODIUM CHLORIDE 0.9 % IV SOLN
INTRAVENOUS | Status: DC
  Administered 2017-08-12: 06:00:00 via INTRAVENOUS

## 2017-08-12 MED ORDER — SODIUM CHLORIDE 0.9 % IV SOLN: 250.0000 mL | INTRAVENOUS | Status: DC | PRN

## 2017-08-12 MED ORDER — HEPARIN (PORCINE) IN NACL 2-0.9 UNITS/ML
INTRAMUSCULAR | Status: AC | PRN
  Administered 2017-08-12: 500 mL

## 2017-08-12 MED ORDER — ONDANSETRON HCL 4 MG/2ML IJ SOLN: 4.0000 mg | Freq: Four times a day (QID) | INTRAMUSCULAR | Status: DC | PRN

## 2017-08-12 SURGICAL SUPPLY — 13 items
BAG SNAP BAND KOVER 36X36 (MISCELLANEOUS) ×3 IMPLANT
CATH BLAZERPRIME XP (ABLATOR) ×3 IMPLANT
CATH DUODECA HALO/ISMUS 7FR (CATHETERS) ×3 IMPLANT
CATH JOSEPHSON QUAD-ALLRED 6FR (CATHETERS) ×3 IMPLANT
CATH POLARIS X 2.5/5/2.5 DECAP (CATHETERS) ×3 IMPLANT
PACK EP LATEX FREE (CUSTOM PROCEDURE TRAY) ×2
PACK EP LF (CUSTOM PROCEDURE TRAY) ×1 IMPLANT
PAD DEFIB LIFELINK (PAD) ×3 IMPLANT
SHEATH AVANTI 11CM 7FR (SHEATH) ×3 IMPLANT
SHEATH AVANTI 11CM 8FR (SHEATH) ×3 IMPLANT
SHEATH PINNACLE 6F 10CM (SHEATH) ×3 IMPLANT
SHEATH PINNACLE 8F 10CM (SHEATH) ×3 IMPLANT
SHIELD RADPAD SCOOP 12X17 (MISCELLANEOUS) ×3 IMPLANT

## 2017-08-12 NOTE — Progress Notes (Addendum)
Site area: Right groin a 6,7, and 8 french venous sheath was removed  Site Prior to Removal:  Level 0  Pressure Applied For 20 MINUTES    Bedrest beginning at 0950am  Manual:   Yes  Patient Status During Pull:  stable  Post Pull Groin Site:  Level 0  Post Pull Instructions Given:  Yes.    Post Pull Pulses Present:  Yes.    Dressing Applied:  Yes.    Comments:  VS remain stable

## 2017-08-12 NOTE — Discharge Instructions (Signed)
**Note Laron Angelini-Identified via Obfuscation** No driving for 4 days. No lifting over 5 lbs for 1 week. No sexual activity for 1 week. You may return to work in 1 week. Keep procedure site clean & dry. If you notice increased pain, swelling, bleeding or pus, call/return!  You may shower, but no soaking baths/hot tubs/pools for 1 week.   Cardiac Ablation, Care After This sheet gives you information about how to care for yourself after your procedure. Your health care provider may also give you more specific instructions. If you have problems or questions, contact your health care provider. What can I expect after the procedure? After the procedure, it is common to have:  Bruising around your puncture site.  Tenderness around your puncture site.  Skipped heartbeats.  Tiredness (fatigue).  Follow these instructions at home: Puncture site care  Follow instructions from your health care provider about how to take care of your puncture site. Make sure you: ? Wash your hands with soap and water before you change your bandage (dressing). If soap and water are not available, use hand sanitizer. ? Change your dressing as told by your health care provider. ? Leave stitches (sutures), skin glue, or adhesive strips in place. These skin closures may need to stay in place for up to 2 weeks. If adhesive strip edges start to loosen and curl up, you may trim the loose edges. Do not remove adhesive strips completely unless your health care provider tells you to do that.  Check your puncture site every day for signs of infection. Check for: ? Redness, swelling, or pain. ? Fluid or blood. If your puncture site starts to bleed, lie down on your back, apply firm pressure to the area, and contact your health care provider. ? Warmth. ? Pus or a bad smell. Driving  Ask your health care provider when it is safe for you to drive again after the procedure.  Do not drive or use heavy machinery while taking prescription pain medicine.  Do not drive for 24  hours if you were given a medicine to help you relax (sedative) during your procedure. Activity  Avoid activities that take a lot of effort for at least 3 days after your procedure.  Do not lift anything that is heavier than 10 lb (4.5 kg), or the limit that you are told, until your health care provider says that it is safe.  Return to your normal activities as told by your health care provider. Ask your health care provider what activities are safe for you. General instructions  Take over-the-counter and prescription medicines only as told by your health care provider.  Do not use any products that contain nicotine or tobacco, such as cigarettes and e-cigarettes. If you need help quitting, ask your health care provider.  Do not take baths, swim, or use a hot tub until your health care provider approves.  Do not drink alcohol for 24 hours after your procedure.  Keep all follow-up visits as told by your health care provider. This is important. Contact a health care provider if:  You have redness, mild swelling, or pain around your puncture site.  You have fluid or blood coming from your puncture site that stops after applying firm pressure to the area.  Your puncture site feels warm to the touch.  You have pus or a bad smell coming from your puncture site.  You have a fever.  You have chest pain or discomfort that spreads to your neck, jaw, or arm.  You are sweating **Note Josecarlos Harriott-Identified via Obfuscation** a lot.  You feel nauseous.  You have a fast or irregular heartbeat.  You have shortness of breath.  You are dizzy or light-headed and feel the need to lie down.  You have pain or numbness in the arm or leg closest to your puncture site. Get help right away if:  Your puncture site suddenly swells.  Your puncture site is bleeding and the bleeding does not stop after applying firm pressure to the area. These symptoms may represent a serious problem that is an emergency. Do not wait to see if the symptoms will  go away. Get medical help right away. Call your local emergency services (911 in the [place] Do not drive yourself to the hospital. Summary  After the procedure, it is normal to have bruising and tenderness at the puncture site in your groin, neck, or forearm.  Check your puncture site every day for signs of infection.  Get help right away if your puncture site is bleeding and the bleeding does not stop after applying firm pressure to the area. This is a medical emergency. This information is not intended to replace advice given to you by your health care provider. Make sure you discuss any questions you have with your health care provider. Document Released: 05/16/2016 Document Revised: 05/16/2016 Document Reviewed: 05/16/2016 Elsevier Interactive Patient Education  2018 [employer].

## 2017-08-12 NOTE — Interval H&P Note (Signed)
History and Physical Interval Note:  08/12/2017 7:11 AM  Devin [name].  has presented today for surgery, with the diagnosis of afluuter  The various methods of treatment have been discussed with the patient and family. After consideration of risks, benefits and other options for treatment, the patient has consented to  Procedure(s): A-FLUTTER ABLATION (N/A) as a surgical intervention .  The patient's history has been reviewed, patient examined, no change in status, stable for surgery.  I have reviewed the patient's chart and labs.  Questions were answered to the patient's satisfaction.     Cristopher Peru

## 2017-08-13 ENCOUNTER — Encounter (HOSPITAL_COMMUNITY): Payer: Self-pay | Admitting: Internal Medicine

## 2017-08-18 ENCOUNTER — Ambulatory Visit: Payer: Commercial Managed Care - PPO | Admitting: Internal Medicine

## 2017-09-16 ENCOUNTER — Ambulatory Visit (INDEPENDENT_AMBULATORY_CARE_PROVIDER_SITE_OTHER): Payer: Commercial Managed Care - PPO | Admitting: Internal Medicine

## 2017-09-16 ENCOUNTER — Encounter: Payer: Self-pay | Admitting: Internal Medicine

## 2017-09-16 VITALS — BP 126/82 | HR 78 | Ht 72.0 in | Wt 198.0 lb

## 2017-09-16 DIAGNOSIS — I483 Typical atrial flutter: Secondary | ICD-10-CM

## 2017-09-16 NOTE — Patient Instructions (Addendum)
Medication Instructions:  Your physician recommends that you continue on your current medications as directed. Please refer to the Current Medication list given to you today.  Labwork: None ordered.  Testing/Procedures: None ordered.  Follow-Up: Your physician wants you to follow-up in: as needed with Dr. Taylor.      Any Other Special Instructions Will Be Listed Below (If Applicable).  If you need a refill on your cardiac medications before your next appointment, please call your pharmacy.   

## 2017-09-16 NOTE — Progress Notes (Signed)
HPI Mr. Devin [name] returns today for followup. He is a pleasant 42 yo man with a h/o atrial flutter who underwent EPS/RFA of atrial flutter several weeks ago. In the interim, he has done well. He denies chest pain or sob. No syncope. He notes some heel pain when he bends over.  No Known Allergies   Current Outpatient Medications  Medication Sig Dispense Refill  . acetaminophen (TYLENOL) 500 MG tablet Take 1 tablet (500 mg total) by mouth every 6 (six) hours as needed for mild pain (or Fever >/= 101). 1 tablet 0  . apixaban (ELIQUIS) 5 MG TABS tablet Take 1 tablet (5 mg total) by mouth 2 (two) times daily. 60 tablet 5  . carvedilol (COREG) 12.5 MG tablet Take 1 tablet (12.5 mg total) by mouth 2 (two) times daily with a meal. 60 tablet 3  . hydrOXYzine (ATARAX/VISTARIL) 50 MG tablet Take 1-2 tablets (50-100 mg total) by mouth 3 (three) times daily as needed for anxiety. 90 tablet 1  . sacubitril-valsartan (ENTRESTO) 97-103 MG Take 1 tablet by mouth 2 (two) times daily. 60 tablet 3  . spironolactone (ALDACTONE) 25 MG tablet Take 1 tablet (25 mg total) by mouth at bedtime. 30 tablet 3   No current facility-administered medications for this visit.      Past Medical History:  Diagnosis Date  . CHF (congestive heart failure) ([place])   . Hyperlipidemia   . Hypertension     ROS:   All systems reviewed and negative except as noted in the HPI.   Past Surgical History:  Procedure Laterality Date  . A-FLUTTER ABLATION N/A 08/12/2017   Procedure: A-FLUTTER ABLATION;  Surgeon: Evans Lance, MD;  Location: [facility];  Service: Cardiovascular;  Laterality: N/A;  . CARDIOVERSION N/A 01/27/2017   Procedure: CARDIOVERSION;  Surgeon: Sanda Klein, MD;  Location: [facility];  Service: Cardiovascular;  Laterality: N/A;  . NO PAST SURGERIES    . RIGHT/LEFT HEART CATH AND CORONARY ANGIOGRAPHY N/A 03/21/2017   Procedure: RIGHT/LEFT HEART CATH AND CORONARY ANGIOGRAPHY;  Surgeon: Larey Dresser, MD;  Location: [facility];  Service: Cardiovascular;  Laterality: N/A;  . TEE WITHOUT CARDIOVERSION N/A 01/27/2017   Procedure: TRANSESOPHAGEAL ECHOCARDIOGRAM (TEE);  Surgeon: Sanda Klein, MD;  Location: [facility] ENDOSCOPY;  Service: Cardiovascular;  Laterality: N/A;     Family History  Problem Relation Age of Onset  . Diabetes Mother   . Hypertension Mother   . Hypertension Father   . Hypertension Sister   . Asthma Son   . Cancer Maternal Grandfather   . Cancer Paternal Grandmother        cancer  . Heart attack Paternal Grandfather      Social History   Socioeconomic History  . Marital status: Married    Spouse name: Not on file  . Number of children: Not on file  . Years of education: Not on file  . Highest education level: Not on file  Occupational History  . Not on file  Social Needs  . Financial resource strain: Not on file  . Food insecurity:    Worry: Not on file    Inability: Not on file  . Transportation needs:    Medical: Not on file    Non-medical: Not on file  Tobacco Use  . Smoking status: Former Smoker    Packs/day: 0.40    Years: 22.00    Pack years: 8.80    Types: Cigarettes  . Smokeless tobacco:  Never Used  . Tobacco comment: quit while in hosp 01/25/17  Substance and Sexual Activity  . Alcohol use: No    Frequency: Never    Comment: quit 01/25/17  . Drug use: No  . Sexual activity: Never    Partners: Female  Lifestyle  . Physical activity:    Days per week: Not on file    Minutes per session: Not on file  . Stress: Not on file  Relationships  . Social connections:    Talks on phone: Not on file    Gets together: Not on file    Attends religious service: Not on file    Active member of club or organization: Not on file    Attends meetings of clubs or organizations: Not on file    Relationship status: Not on file  . Intimate partner violence:    Fear of current or ex partner: Not on file    Emotionally abused: Not on  file    Physically abused: Not on file    Forced sexual activity: Not on file  Other Topics Concern  . Not on file  Social History Narrative  . Not on file     BP 126/82   Pulse 78   Ht 6' (1.829 m)   Wt 198 lb (89.8 kg)   BMI 26.85 kg/m   Physical Exam:  Well appearing NAD HEENT: Unremarkable Neck:  6 cm JVD, no thyromegally Lymphatics:  No adenopathy Back:  No CVA tenderness Lungs:  Clear with no wheezes HEART:  Regular rate rhythm, no murmurs, no rubs, no clicks Abd:  soft, positive bowel sounds, no organomegally, no rebound, no guarding Ext:  2 plus pulses, no edema, no cyanosis, no clubbing Skin:  No rashes no nodules Neuro:  CN II through XII intact, motor grossly intact  EKG - nsr  DEVICE  Normal device function.  See PaceArt for details.   Assess/Plan: 1. Atrial flutter - he is s/p EP study and ablation and appears to be maintaining NSR. From my perspective he could come off of the Eliquis but I will defer to Dr. Reine Just.  2. Chronic systolic heart failure - he appears to be euvolemic. He will continue his current meds. His last echo EF was 25-30%. At some point he will need to consider a repeat echo as he has been on maximal medical therapy and is back in NSR. Hopefully his EF will have improved. 3. HTN - his blood pressure is well controlled.  Devin [name].D.

## 2017-09-17 ENCOUNTER — Ambulatory Visit (HOSPITAL_COMMUNITY)
Admission: RE | Admit: 2017-09-17 | Discharge: 2017-09-17 | Disposition: A | Discharge status: Home / Self Care | Payer: Commercial Managed Care - PPO | Source: Ambulatory Visit | Attending: Cardiology | Admitting: Cardiology

## 2017-09-17 VITALS — BP 126/78 | HR 82 | Wt 198.2 lb

## 2017-09-17 DIAGNOSIS — I5021 Acute systolic (congestive) heart failure: Secondary | ICD-10-CM | POA: Diagnosis not present

## 2017-09-17 DIAGNOSIS — I483 Typical atrial flutter: Secondary | ICD-10-CM | POA: Diagnosis not present

## 2017-09-17 DIAGNOSIS — I5022 Chronic systolic (congestive) heart failure: Secondary | ICD-10-CM | POA: Insufficient documentation

## 2017-09-17 LAB — BASIC METABOLIC PANEL
ANION GAP: 9 (ref 5–15)
BUN: 15 mg/dL (ref 6–20)
CALCIUM: 8.9 mg/dL (ref 8.9–10.3)
CHLORIDE: 103 mmol/L (ref 98–111)
CO2: 26 mmol/L (ref 22–32)
Creatinine, Ser: 1.06 mg/dL (ref 0.61–1.24)
GFR calc non Af Amer: >60 mL/min (ref >60)
GLUCOSE: 87 mg/dL (ref 70–99)
POTASSIUM: 4.7 mmol/L (ref 3.5–5.1)
Sodium: 138 mmol/L (ref 135–145)

## 2017-09-17 MED ORDER — CARVEDILOL 12.5 MG PO TABS: 18.7500 mg | ORAL_TABLET | Freq: Two times a day (BID) | ORAL | 3 refills | Status: DC

## 2017-09-17 NOTE — Patient Instructions (Signed)
Labs today (will call for abnormal results, otherwise no news is good news)  STOP taking Eliquis  INCREASE Carvedilol to 18.75 mg (1.5 Tablets) Twice Daily.  Someone will be contacting you to schedule your Cardiac MRI.   Follow up in 3 months.

## 2017-09-18 NOTE — Progress Notes (Signed)
PCP: Dr. Nani Ravens Cardiology: Dr. Aundra Dubin  42 yo with history of HTN, paroxysmal atrial flutter, and cardiomyopathy presents for followup of CHF.   Patient had history of untreated hypertension (diagnosed summer 2018) and hyperlipidemia.  He was admitted in 12/18 with epigastric pain, orthopnea, and dyspnea that had gone on for several days.  He did not free palpitations.  He was noted to be in atrial flutter with RVR and in CHF.  Prior to this episode, he had no exertional symptoms. Echo was done, showing EF 20-25% with moderately decreased RV systolic function.  Of note, prior to admission he was drinking up to 12 beers/day.  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  In the hospital, he was diuresed and cardioverted.  LHC/RHC was done in 1/19 with no coronary disease and normal filling pressures.   In 6/19, he had an atrial flutter ablation.   He is in NSR today. No exertional dyspnea or chest pain.  No orthopnea/PND. Working full time.  Really no limitations.   ECG (personally reviewed): NSR, nonspecific anterolateral TWIs  Labs (10/18): HIV negative, normal Fe studies.  Labs (12/18): K 4, creatinine 1.35, hgb 14.7, decreased TSH, normal free T3 and free T4 Labs (1/19): K 4.3, creatinine 1.0 Labs (2/19): K 4.7, creatinine 0.99 Labs (3/19): K 4.1, creatinine 1.12  PMH: 1. HTN 2. Hyperlipidemia 3. ETOH abuse 4. Atrial flutter: Paroxysmal.  1st noted in 12/18.  - DCCV 12/18.  - Atrial flutter ablation in 6/19.  5. Chronic systolic CHF: Uncertain etiology.  HIV negative 10/18.  HTN, ETOH abuse could play a role.  Also consider tachy-mediated CMP in setting of atrial flutter with RVR.   - Echo (12/18): EF 20-25%, mild LV dilation, mild LVH, mild-moderate MR, moderately decreased RV systolic function, PASP 44 mmHg.  - TEE (12/18): EF 10-15%, moderate LV dilation, mild-moderate MR, moderately decreased RV systolic function.  - Echo (1/19): EF 25-30%, diffuse  hypokinesis, moderate diastolic dysfunction, normal RV size and systolic function.  - LHC/RHC (1/19): No significant CAD, EF 30%; mean RA 6, PA 24/10, mean PCWP 9, CI 2.74  SH: Lives in [place], married, works as Therapist, music.  Drank 12 beers/night prior to 12/18 admission, now has quit.  Quit smoking in 12/18.   FH: Uncle with pacemaker, aunt with MI.  Parents/siblings no cardiac history.   ROS: All systems reviewed and negative except as per HPI.   Current Outpatient Medications  Medication Sig Dispense Refill  . acetaminophen (TYLENOL) 500 MG tablet Take 1 tablet (500 mg total) by mouth every 6 (six) hours as needed for mild pain (or Fever >/= 101). 1 tablet 0  . carvedilol (COREG) 12.5 MG tablet Take 1.5 tablets (18.75 mg total) by mouth 2 (two) times daily with a meal. 90 tablet 3  . hydrOXYzine (ATARAX/VISTARIL) 50 MG tablet Take 1-2 tablets (50-100 mg total) by mouth 3 (three) times daily as needed for anxiety. 90 tablet 1  . sacubitril-valsartan (ENTRESTO) 97-103 MG Take 1 tablet by mouth 2 (two) times daily. 60 tablet 3  . spironolactone (ALDACTONE) 25 MG tablet Take 1 tablet (25 mg total) by mouth at bedtime. 30 tablet 3   No current facility-administered medications for this encounter.    BP 126/78   Pulse 82   Wt 198 lb 3.2 oz (89.9 kg)   SpO2 100%   BMI 26.88 kg/m  General: NAD Neck: No JVD, no thyromegaly or thyroid nodule.  Lungs: Clear to  auscultation bilaterally with normal respiratory effort. CV: Nondisplaced PMI.  Heart regular S1/S2, no S3/S4, no murmur.  No peripheral edema.  No carotid bruit.  Normal pedal pulses.  Abdomen: Soft, nontender, no hepatosplenomegaly, no distention.  Skin: Intact without lesions or rashes.  Neurologic: Alert and oriented x 3.  Psych: Normal affect. Extremities: No clubbing or cyanosis.  HEENT: Normal.   Assessment/Plan: 1. Chronic systolic CHF: Echo in 95/18 with EF 20-25%, moderately decreased RV systolic function.   Echo  (1/19) with EF 25-30%. Possible causes are tachycardia-mediated CMP (in atrial flutter with RVR 12/18 admission), long-standing poorly controlled HTN, viral myocarditis, and heavy ETOH.  HIV negative, Fe studies negative.  LHC/RHC in 1/19 showed no CAD, preserved cardiac output, and normal filling pressures.  With lack of improvement in EF on 1/19 echo, tachycardia-mediated CMP seems less likely. On exam today, he is euvolemic.  NYHA class I symptoms.   - Cardiac MRI re-ordered (he missed prior appt), need to assess for infiltrative disease/prior myocarditis.  If EF < 35%, will need to discuss ICD (not CRT candidate with narrow QRS). - Continue Entresto 97/103 bid.    - Increase Coreg to 18.75 mg bid.   - Continue spironolactone 25 daily.   - BMET today.  2. HTN: BP controlled.   3. ETOH abuse: Patient has quit drinking.   4. Atrial flutter: Paroxysmal.  I was concerned for tachy-mediated cardiomyopathy, but EF not significantly improved in 1/19 after > 1 month in NSR.  He had atrial flutter ablation in 6/19.  He is in NSR today.  - At this point, he can stop apixaban.   Followup in 3 months  Loralie Champagne 09/18/2017

## 2017-09-23 ENCOUNTER — Encounter: Payer: Self-pay | Admitting: Cardiology

## 2017-10-02 ENCOUNTER — Ambulatory Visit (HOSPITAL_COMMUNITY): Admission: RE | Admit: 2017-10-02 | Payer: Commercial Managed Care - PPO | Source: Ambulatory Visit

## 2017-10-07 ENCOUNTER — Telehealth: Payer: Self-pay | Admitting: Cardiology

## 2017-10-07 ENCOUNTER — Encounter: Payer: Self-pay | Admitting: Cardiology

## 2017-10-07 NOTE — Telephone Encounter (Signed)
Called patient and gave him the date, time and location of cardaic MRI that was rescheduled for 10-16-17 at 3 p.m.  Letter mailed to the patient and nurse today.

## 2017-10-16 ENCOUNTER — Ambulatory Visit (HOSPITAL_COMMUNITY)
Admission: RE | Admit: 2017-10-16 | Discharge: 2017-10-16 | Disposition: A | Discharge status: Home / Self Care | Payer: Commercial Managed Care - PPO | Source: Ambulatory Visit | Attending: Cardiology | Admitting: Cardiology

## 2017-10-16 DIAGNOSIS — I428 Other cardiomyopathies: Secondary | ICD-10-CM | POA: Insufficient documentation

## 2017-10-16 DIAGNOSIS — I429 Cardiomyopathy, unspecified: Secondary | ICD-10-CM

## 2017-10-16 MED ORDER — GADOBENATE DIMEGLUMINE 529 MG/ML IV SOLN
30.0000 mL | Freq: Once | INTRAVENOUS | Status: AC | PRN
  Administered 2017-10-16: 30 mL via INTRAVENOUS

## 2017-11-07 ENCOUNTER — Other Ambulatory Visit (HOSPITAL_COMMUNITY): Payer: Self-pay

## 2017-11-07 MED ORDER — SPIRONOLACTONE 25 MG PO TABS: 25.0000 mg | ORAL_TABLET | Freq: Every day | ORAL | 5 refills | Status: DC

## 2017-11-07 MED ORDER — CARVEDILOL 12.5 MG PO TABS: 18.7500 mg | ORAL_TABLET | Freq: Two times a day (BID) | ORAL | 5 refills | Status: DC

## 2017-12-19 ENCOUNTER — Inpatient Hospital Stay (HOSPITAL_COMMUNITY)
Admission: RE | Admit: 2017-12-19 | Payer: Commercial Managed Care - PPO | Source: Ambulatory Visit | Admitting: Cardiology

## 2017-12-26 ENCOUNTER — Telehealth (HOSPITAL_COMMUNITY): Payer: Self-pay | Admitting: Licensed Clinical Social Worker

## 2017-12-26 NOTE — Telephone Encounter (Signed)
CSW called pt to inquire about missed appointment last Friday.  Pt reports that he just mixed up his days and does not anticipate having issues making it to a future appointment.  CSW transferred pt to front desk to schedule another appt.  CSW will continue to follow in clinic and assist as needed  Jorge Ny, [place] Worker [facility] [phone]

## 2018-01-12 ENCOUNTER — Other Ambulatory Visit (HOSPITAL_COMMUNITY): Payer: Self-pay

## 2018-01-12 MED ORDER — SACUBITRIL-VALSARTAN 97-103 MG PO TABS: 1.0000 | ORAL_TABLET | Freq: Two times a day (BID) | ORAL | 3 refills | Status: DC

## 2018-01-26 ENCOUNTER — Other Ambulatory Visit: Payer: Self-pay

## 2018-01-26 ENCOUNTER — Emergency Department (HOSPITAL_BASED_OUTPATIENT_CLINIC_OR_DEPARTMENT_OTHER): Payer: Commercial Managed Care - PPO

## 2018-01-26 ENCOUNTER — Encounter (HOSPITAL_BASED_OUTPATIENT_CLINIC_OR_DEPARTMENT_OTHER): Payer: Self-pay | Admitting: Emergency Medicine

## 2018-01-26 ENCOUNTER — Emergency Department (HOSPITAL_BASED_OUTPATIENT_CLINIC_OR_DEPARTMENT_OTHER)
Admission: EM | Admit: 2018-01-26 | Discharge: 2018-01-26 | Disposition: A | Discharge status: Home / Self Care | Payer: Commercial Managed Care - PPO | Attending: Emergency Medicine | Admitting: Emergency Medicine

## 2018-01-26 DIAGNOSIS — R079 Chest pain, unspecified: Secondary | ICD-10-CM | POA: Diagnosis not present

## 2018-01-26 DIAGNOSIS — Z5321 Procedure and treatment not carried out due to patient leaving prior to being seen by health care provider: Secondary | ICD-10-CM | POA: Insufficient documentation

## 2018-01-26 HISTORY — DX: Unspecified atrial fibrillation: I48.91

## 2018-01-26 LAB — BASIC METABOLIC PANEL
ANION GAP: 6 (ref 5–15)
BUN: 8 mg/dL (ref 6–20)
CHLORIDE: 105 mmol/L (ref 98–111)
CO2: 26 mmol/L (ref 22–32)
Calcium: 8.5 mg/dL — ABNORMAL LOW (ref 8.9–10.3)
Creatinine, Ser: 0.93 mg/dL (ref 0.61–1.24)
GFR calc Af Amer: >60 mL/min (ref >60)
GFR calc non Af Amer: >60 mL/min (ref >60)
GLUCOSE: 96 mg/dL (ref 70–99)
POTASSIUM: 3.7 mmol/L (ref 3.5–5.1)
Sodium: 137 mmol/L (ref 135–145)

## 2018-01-26 LAB — CBC
HCT: 42.6 % (ref 39.0–52.0)
HEMOGLOBIN: 13.3 g/dL (ref 13.0–17.0)
MCH: 28.4 pg (ref 26.0–34.0)
MCHC: 31.2 g/dL (ref 30.0–36.0)
MCV: 91 fL (ref 80.0–100.0)
Platelets: 261 10*3/uL (ref 150–400)
RBC: 4.68 MIL/uL (ref 4.22–5.81)
RDW: 13.7 % (ref 11.5–15.5)
WBC: 6.3 10*3/uL (ref 4.0–10.5)
nRBC: 0 % (ref 0.0–0.2)

## 2018-01-26 LAB — TROPONIN I

## 2018-01-26 NOTE — ED Triage Notes (Signed)
Intermittent Sharp left sided chest pain x8 hours.  No radiation. Denies n/v or diaphoresis.

## 2018-02-03 ENCOUNTER — Encounter: Payer: Self-pay | Admitting: Family Medicine

## 2018-02-03 ENCOUNTER — Ambulatory Visit (INDEPENDENT_AMBULATORY_CARE_PROVIDER_SITE_OTHER): Payer: Commercial Managed Care - PPO | Admitting: Family Medicine

## 2018-02-03 VITALS — BP 124/74 | HR 82 | Temp 98.4°F | Ht 72.0 in | Wt 204.4 lb

## 2018-02-03 DIAGNOSIS — Z0000 Encounter for general adult medical examination without abnormal findings: Secondary | ICD-10-CM

## 2018-02-03 DIAGNOSIS — Z125 Encounter for screening for malignant neoplasm of prostate: Secondary | ICD-10-CM | POA: Diagnosis not present

## 2018-02-03 DIAGNOSIS — Z23 Encounter for immunization: Secondary | ICD-10-CM | POA: Diagnosis not present

## 2018-02-03 NOTE — Addendum Note (Signed)
Addended by: Sharon Seller B on: 02/03/2018 03:14 PM   Modules accepted: Orders

## 2018-02-03 NOTE — Progress Notes (Signed)
Chief Complaint  Patient presents with  . Annual Exam    Well Male Devin [name]. is here for a complete physical.   His last physical was >1 year ago.  Current diet: in general, a "fair" diet.   Current exercise: walking Weight trend: stable overall Daytime fatigue? Sometimes Seat belt? Yes.    Health maintenance Tetanus- Yes HIV- Yes  Past Medical History:  Diagnosis Date  . A-fib ([place])   . CHF (congestive heart failure) ([place])   . Hyperlipidemia   . Hypertension      Past Surgical History:  Procedure Laterality Date  . A-FLUTTER ABLATION N/A 08/12/2017   Procedure: A-FLUTTER ABLATION;  Surgeon: Evans Lance, MD;  Location: [facility];  Service: Cardiovascular;  Laterality: N/A;  . CARDIOVERSION N/A 01/27/2017   Procedure: CARDIOVERSION;  Surgeon: Sanda Klein, MD;  Location: [facility];  Service: Cardiovascular;  Laterality: N/A;  . NO PAST SURGERIES    . RIGHT/LEFT HEART CATH AND CORONARY ANGIOGRAPHY N/A 03/21/2017   Procedure: RIGHT/LEFT HEART CATH AND CORONARY ANGIOGRAPHY;  Surgeon: Larey Dresser, MD;  Location: [facility];  Service: Cardiovascular;  Laterality: N/A;  . TEE WITHOUT CARDIOVERSION N/A 01/27/2017   Procedure: TRANSESOPHAGEAL ECHOCARDIOGRAM (TEE);  Surgeon: Sanda Klein, MD;  Location: [facility] ENDOSCOPY;  Service: Cardiovascular;  Laterality: N/A;    Medications  Current Outpatient Medications on File Prior to Visit  Medication Sig Dispense Refill  . acetaminophen (TYLENOL) 500 MG tablet Take 1 tablet (500 mg total) by mouth every 6 (six) hours as needed for mild pain (or Fever >/= 101). 1 tablet 0  . carvedilol (COREG) 12.5 MG tablet Take 1.5 tablets (18.75 mg total) by mouth 2 (two) times daily with a meal. 90 tablet 5  . hydrOXYzine (ATARAX/VISTARIL) 50 MG tablet Take 1-2 tablets (50-100 mg total) by mouth 3 (three) times daily as needed for anxiety. 90 tablet 1  . sacubitril-valsartan (ENTRESTO) 97-103 MG Take 1 tablet by  mouth 2 (two) times daily. 60 tablet 3  . spironolactone (ALDACTONE) 25 MG tablet Take 1 tablet (25 mg total) by mouth at bedtime. 30 tablet 5   Allergies No Known Allergies  Family History Family History  Problem Relation Age of Onset  . Diabetes Mother   . Hypertension Mother   . Hypertension Father   . Hypertension Sister   . Asthma Son   . Cancer Maternal Grandfather   . Cancer Paternal Grandmother        cancer  . Heart attack Paternal Grandfather     Review of Systems: Constitutional: no fevers or chills Eye:  no recent significant change in vision Ear/Nose/Mouth/Throat:  Ears:  no tinnitus or hearing loss Nose/Mouth/Throat:  no complaints of nasal congestion, no sore throat Cardiovascular:  no chest pain, no palpitations Respiratory:  no cough and no shortness of breath Gastrointestinal:  no abdominal pain, no change in bowel habits GU:  Male: negative for dysuria, frequency, and incontinence and negative for prostate symptoms Musculoskeletal/Extremities:  no pain, redness, or swelling of the joints Integumentary (Skin/Breast):  no abnormal skin lesions reported Neurologic:  no headaches, no numbness, tingling Endocrine: No unexpected weight changes Hematologic/Lymphatic:  no night sweats  Exam BP 124/74 (BP Location: Left Arm, Patient Position: Sitting, Cuff Size: Normal)   Pulse 82   Temp 98.4 F (36.9 C) (Oral)   Ht 6' (1.829 m)   Wt 204 lb 6 oz (92.7 kg)   SpO2 96%   BMI 27.72 kg/m  General:  well developed, well nourished, in no apparent distress Skin:  no significant moles, warts, or growths Head:  no masses, lesions, or tenderness Eyes:  pupils equal and round, sclera anicteric without injection Ears:  canals without lesions, TMs shiny without retraction, no obvious effusion, no erythema Nose:  nares patent, septum midline, mucosa normal Throat/Pharynx:  lips and gingiva without lesion; tongue and uvula midline; non-inflamed pharynx; no exudates or  postnasal drainage Neck: neck supple without adenopathy, thyromegaly, or masses Lungs:  clear to auscultation, breath sounds equal bilaterally, no respiratory distress Cardio:  regular rate and rhythm, no bruits, no LE edema Abdomen:  abdomen soft, nontender; bowel sounds normal; no masses or organomegaly Genital (male): circumcised penis, no lesions or discharge; testes present bilaterally without masses or tenderness Rectal: Deferred Musculoskeletal:  symmetrical muscle groups noted without atrophy or deformity Extremities:  no clubbing, cyanosis, or edema, no deformities, no skin discoloration Neuro:  gait normal; deep tendon reflexes normal and symmetric Psych: well oriented with normal range of affect and appropriate judgment/insight  Assessment and Plan  Well adult exam - Plan: Comprehensive metabolic panel, Lipid panel  Screening for prostate cancer - Plan: PSA   Well 42 y.o. male. Counseled on diet and exercise. Counseled on risks and benefits of prostate cancer screening according to AUA recs. He agrees to undergo testing. Other orders as above. He has a form to fill out, will drop off that requires signature. No charge for this.  Follow up in 1 year pending the above workup. The patient voiced understanding and agreement to the plan.  [place], DO 02/03/18 2:59 PM

## 2018-02-03 NOTE — Patient Instructions (Signed)
Give us 2-3 business days to get the results of your labs back.   Keep the diet clean and stay active.  Let us know if you need anything. 

## 2018-02-03 NOTE — Progress Notes (Signed)
Pre visit review using our clinic review tool, if applicable. No additional management support is needed unless otherwise documented below in the visit note. 

## 2018-02-04 ENCOUNTER — Other Ambulatory Visit: Payer: Self-pay | Admitting: Family Medicine

## 2018-02-04 LAB — COMPREHENSIVE METABOLIC PANEL
ALT: 84 U/L — ABNORMAL HIGH (ref 0–53)
AST: 43 U/L — ABNORMAL HIGH (ref 0–37)
Albumin: 4.5 g/dL (ref 3.5–5.2)
Alkaline Phosphatase: 83 U/L (ref 39–117)
BILIRUBIN TOTAL: 0.2 mg/dL (ref 0.2–1.2)
BUN: 10 mg/dL (ref 6–23)
CALCIUM: 9.4 mg/dL (ref 8.4–10.5)
CHLORIDE: 103 meq/L (ref 96–112)
CO2: 30 meq/L (ref 19–32)
Creatinine, Ser: 1.29 mg/dL (ref 0.40–1.50)
GFR: 78.94 mL/min (ref >60.00)
Glucose, Bld: 105 mg/dL — ABNORMAL HIGH (ref 70–99)
POTASSIUM: 4.7 meq/L (ref 3.5–5.1)
Sodium: 142 mEq/L (ref 135–145)
Total Protein: 7.4 g/dL (ref 6.0–8.3)

## 2018-02-04 LAB — LIPID PANEL
CHOL/HDL RATIO: 6
Cholesterol: 293 mg/dL — ABNORMAL HIGH (ref 0–200)
HDL: 50 mg/dL (ref >39.00)

## 2018-02-04 LAB — LDL CHOLESTEROL, DIRECT: LDL DIRECT: 186 mg/dL

## 2018-02-04 LAB — PSA: PSA: 0.52 ng/mL (ref 0.10–4.00)

## 2018-02-13 ENCOUNTER — Encounter: Payer: Self-pay | Admitting: Family Medicine

## 2018-02-16 ENCOUNTER — Encounter: Payer: Self-pay | Admitting: Family Medicine

## 2018-02-20 ENCOUNTER — Telehealth: Payer: Self-pay | Admitting: Family Medicine

## 2018-02-20 ENCOUNTER — Other Ambulatory Visit: Payer: Self-pay | Admitting: Family Medicine

## 2018-02-20 DIAGNOSIS — E782 Mixed hyperlipidemia: Secondary | ICD-10-CM

## 2018-02-20 NOTE — Telephone Encounter (Signed)
OK. Please order lipid panel. TY.

## 2018-02-20 NOTE — Telephone Encounter (Signed)
Lab ordered///appt made////scheduled with the patients wife.

## 2018-02-20 NOTE — Telephone Encounter (Signed)
Copied from [place] [id number]. Topic: General - Other >> Feb 20, 2018  7:20 AM Lennox Solders wrote: Reason for CRM Pt wife Nira Conn is calling her husband was not fasting when he had labs drawn on 12/17/ pt would like to have labs repeat. Please advice

## 2018-02-23 ENCOUNTER — Encounter: Payer: Self-pay | Admitting: Family Medicine

## 2018-02-23 ENCOUNTER — Other Ambulatory Visit (INDEPENDENT_AMBULATORY_CARE_PROVIDER_SITE_OTHER): Payer: Commercial Managed Care - PPO

## 2018-02-23 ENCOUNTER — Other Ambulatory Visit: Payer: Self-pay | Admitting: Family Medicine

## 2018-02-23 DIAGNOSIS — E782 Mixed hyperlipidemia: Secondary | ICD-10-CM | POA: Diagnosis not present

## 2018-02-23 LAB — LIPID PANEL
CHOL/HDL RATIO: 5
CHOLESTEROL: 247 mg/dL — AB (ref 0–200)
HDL: 48.1 mg/dL (ref >39.00)
NONHDL: 198.52
TRIGLYCERIDES: 256 mg/dL — AB (ref 0.0–149.0)
VLDL: 51.2 mg/dL — AB (ref 0.0–40.0)

## 2018-02-23 LAB — LDL CHOLESTEROL, DIRECT: Direct LDL: 151 mg/dL

## 2018-02-23 MED ORDER — ATORVASTATIN CALCIUM 40 MG PO TABS: 40.0000 mg | ORAL_TABLET | Freq: Every day | ORAL | 3 refills | Status: DC

## 2018-03-12 ENCOUNTER — Ambulatory Visit (HOSPITAL_COMMUNITY)
Admission: RE | Admit: 2018-03-12 | Discharge: 2018-03-12 | Disposition: A | Discharge status: Home / Self Care | Payer: Commercial Managed Care - PPO | Source: Ambulatory Visit | Attending: Cardiology | Admitting: Cardiology

## 2018-03-12 VITALS — BP 138/99 | HR 83 | Wt 203.0 lb

## 2018-03-12 DIAGNOSIS — Z87891 Personal history of nicotine dependence: Secondary | ICD-10-CM | POA: Insufficient documentation

## 2018-03-12 DIAGNOSIS — I110 Hypertensive heart disease with heart failure: Secondary | ICD-10-CM | POA: Insufficient documentation

## 2018-03-12 DIAGNOSIS — Z8249 Family history of ischemic heart disease and other diseases of the circulatory system: Secondary | ICD-10-CM | POA: Diagnosis not present

## 2018-03-12 DIAGNOSIS — E785 Hyperlipidemia, unspecified: Secondary | ICD-10-CM | POA: Insufficient documentation

## 2018-03-12 DIAGNOSIS — F1010 Alcohol abuse, uncomplicated: Secondary | ICD-10-CM | POA: Diagnosis not present

## 2018-03-12 DIAGNOSIS — I4892 Unspecified atrial flutter: Secondary | ICD-10-CM | POA: Insufficient documentation

## 2018-03-12 DIAGNOSIS — Z79899 Other long term (current) drug therapy: Secondary | ICD-10-CM | POA: Diagnosis not present

## 2018-03-12 DIAGNOSIS — I483 Typical atrial flutter: Secondary | ICD-10-CM

## 2018-03-12 DIAGNOSIS — I5022 Chronic systolic (congestive) heart failure: Secondary | ICD-10-CM | POA: Diagnosis not present

## 2018-03-12 DIAGNOSIS — I429 Cardiomyopathy, unspecified: Secondary | ICD-10-CM | POA: Diagnosis not present

## 2018-03-12 DIAGNOSIS — I509 Heart failure, unspecified: Secondary | ICD-10-CM | POA: Diagnosis not present

## 2018-03-12 LAB — COMPREHENSIVE METABOLIC PANEL
ALK PHOS: 90 U/L (ref 38–126)
ALT: 68 U/L — AB (ref 0–44)
AST: 42 U/L — AB (ref 15–41)
Albumin: 4.1 g/dL (ref 3.5–5.0)
Anion gap: 9 (ref 5–15)
BILIRUBIN TOTAL: 0.6 mg/dL (ref 0.3–1.2)
BUN: 12 mg/dL (ref 6–20)
CHLORIDE: 103 mmol/L (ref 98–111)
CO2: 26 mmol/L (ref 22–32)
CREATININE: 1.27 mg/dL — AB (ref 0.61–1.24)
Calcium: 9 mg/dL (ref 8.9–10.3)
GFR calc Af Amer: >60 mL/min (ref >60)
GFR calc non Af Amer: >60 mL/min (ref >60)
GLUCOSE: 99 mg/dL (ref 70–99)
Potassium: 3.9 mmol/L (ref 3.5–5.1)
Sodium: 138 mmol/L (ref 135–145)
Total Protein: 7.3 g/dL (ref 6.5–8.1)

## 2018-03-12 MED ORDER — SACUBITRIL-VALSARTAN 97-103 MG PO TABS: 1.0000 | ORAL_TABLET | Freq: Two times a day (BID) | ORAL | 3 refills | Status: DC

## 2018-03-12 NOTE — Patient Instructions (Signed)
Labs done today. We will call you for any abnormal labs.  EKG done today.  RESTART Entresto 97-103 1 tab twice daily.  Your physician has requested that you have an echocardiogram. Echocardiography is a painless test that uses sound waves to create images of your heart. It provides your doctor with information about the size and shape of your heart and how well your heart's chambers and valves are working. This procedure takes approximately one hour. There are no restrictions for this procedure. This will be done right before your next appointment with Dr. Aundra Dubin.  Follow up with Dr. Aundra Dubin in 3 months

## 2018-03-12 NOTE — Progress Notes (Signed)
PCP: Dr. Nani Ravens Cardiology: Dr. Aundra Dubin  43 y.o. with history of HTN, paroxysmal atrial flutter, and cardiomyopathy presents for followup of CHF.   Patient had history of untreated hypertension (diagnosed summer 2018) and hyperlipidemia.  He was admitted in 12/18 with epigastric pain, orthopnea, and dyspnea that had gone on for several days.  He did not free palpitations.  He was noted to be in atrial flutter with RVR and in CHF.  Prior to this episode, he had no exertional symptoms. Echo was done, showing EF 20-25% with moderately decreased RV systolic function.  Of note, prior to admission he was drinking up to 12 beers/day.  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  In the hospital, he was diuresed and cardioverted.  LHC/RHC was done in 1/19 with no coronary disease and normal filling pressures.   In 6/19, he had an atrial flutter ablation.   Cardiac MRI in 8/19 showed EF 40%, mild-moderate LV dilation, normal RV size and systolic function, no LGE.  He is in NSR today. No exertional dyspnea or chest pain. He works full time and exercises occasionally. He reports no limitations.  I note that LFTs were elevated on labs from 12/19.  He reports 3-4 beers nightly. Based on LFTs, I am concerned that he may drink even more heavily.  He has been out of Entresto for a month, taking other meds.  PCP started a statin.   ECG (personally reviewed): NSR, borderline LVH with repolarization abnormality.   Labs (10/18): HIV negative, normal Fe studies.  Labs (12/18): K 4, creatinine 1.35, hgb 14.7, decreased TSH, normal free T3 and free T4 Labs (1/19): K 4.3, creatinine 1.0 Labs (2/19): K 4.7, creatinine 0.99 Labs (3/19): K 4.1, creatinine 1.12 Labs (12/19): AST 43, ALT 84, K 4.7, creatinine 1.29 Labs (1/20): LDL 151, TGs 256  PMH: 1. HTN 2. Hyperlipidemia 3. ETOH abuse 4. Atrial flutter: Paroxysmal.  1st noted in 12/18.  - DCCV 12/18.  - Atrial flutter ablation in 6/19.  5.  Chronic systolic CHF: Uncertain etiology.  HIV negative 10/18.  HTN, ETOH abuse could play a role.  Also consider tachy-mediated CMP in setting of atrial flutter with RVR.   - Echo (12/18): EF 20-25%, mild LV dilation, mild LVH, mild-moderate MR, moderately decreased RV systolic function, PASP 44 mmHg.  - TEE (12/18): EF 10-15%, moderate LV dilation, mild-moderate MR, moderately decreased RV systolic function.  - Echo (1/19): EF 25-30%, diffuse hypokinesis, moderate diastolic dysfunction, normal RV size and systolic function.  - LHC/RHC (1/19): No significant CAD, EF 30%; mean RA 6, PA 24/10, mean PCWP 9, CI 2.74 - Cardiac MRI (8/19): EF 40%, mild-moderate LV dilation, normal RV size and systolic function, no LGE.  SH: Lives in [place], married, works as Therapist, music.  Drank 12 beers/night prior to 12/18 admission, quit for a while but now back to drinking 3-4 beers/day per his report.  Quit smoking in 12/18.   FH: Uncle with pacemaker, aunt with MI.  Parents/siblings no cardiac history.   ROS: All systems reviewed and negative except as per HPI.   Current Outpatient Medications  Medication Sig Dispense Refill  . acetaminophen (TYLENOL) 500 MG tablet Take 1 tablet (500 mg total) by mouth every 6 (six) hours as needed for mild pain (or Fever >/= 101). 1 tablet 0  . atorvastatin (LIPITOR) 40 MG tablet Take 1 tablet (40 mg total) by mouth daily. 90 tablet 3  . carvedilol (COREG) 12.5 MG  tablet Take 1.5 tablets (18.75 mg total) by mouth 2 (two) times daily with a meal. 90 tablet 5  . spironolactone (ALDACTONE) 25 MG tablet Take 1 tablet (25 mg total) by mouth at bedtime. 30 tablet 5  . hydrOXYzine (ATARAX/VISTARIL) 50 MG tablet Take 1-2 tablets (50-100 mg total) by mouth 3 (three) times daily as needed for anxiety. (Patient not taking: Reported on 03/12/2018) 90 tablet 1  . sacubitril-valsartan (ENTRESTO) 97-103 MG Take 1 tablet by mouth 2 (two) times daily. 60 tablet 3   No current  facility-administered medications for this encounter.    BP (!) 138/99   Pulse 83   Wt 92.1 kg (203 lb)   SpO2 99%   BMI 27.53 kg/m  General: NAD Neck: No JVD, no thyromegaly or thyroid nodule.  Lungs: Clear to auscultation bilaterally with normal respiratory effort. CV: Nondisplaced PMI.  Heart regular S1/S2, no S3/S4, no murmur.  No peripheral edema.  No carotid bruit.  Normal pedal pulses.  Abdomen: Soft, nontender, no hepatosplenomegaly, no distention.  Skin: Intact without lesions or rashes.  Neurologic: Alert and oriented x 3.  Psych: Normal affect. Extremities: No clubbing or cyanosis.  HEENT: Normal.   Assessment/Plan: 1. Chronic systolic CHF: Echo in 88/41 with EF 20-25%, moderately decreased RV systolic function.   Echo (1/19) with EF 25-30%. Possible causes are tachycardia-mediated CMP (in atrial flutter with RVR 12/18 admission), long-standing poorly controlled HTN, viral myocarditis, and heavy ETOH.  HIV negative, Fe studies negative.  LHC/RHC in 1/19 showed no CAD, preserved cardiac output, and normal filling pressures.  With lack of improvement in EF on 1/19 echo, tachycardia-mediated CMP seems less likely. Cardiac MRI in 8/19 showed EF up to 40% with normal RV, no LGE.  I am concerned that ETOH may play an ongoing role in his cardiomyopathy as he self-reports 3-4 beers/night and LFTs are elevated.  On exam today, he is euvolemic.  NYHA class I symptoms.   - EF is out of ICD range.  - Repeat echo at followup in 3 months. - Restart Entresto 97/103 bid.    - Continue Coreg 18.75 mg bid.   - Continue spironolactone 25 daily.   - Will get CMET today. - We discussed the need to cut back considerably on ETOH intake.  2. HTN: BP high, restart Entresto.   3. ETOH abuse: Patient has started drinking again.  He needs to cut back. We discussed this.    4. Atrial flutter: Paroxysmal.  I was concerned for tachy-mediated cardiomyopathy, but EF not significantly improved in 1/19 after >  1 month in NSR.  He had atrial flutter ablation in 6/19.  He is in NSR today.  - He is now off apixaban.    Followup in 3 months with echo.   Loralie Champagne 03/12/2018

## 2018-03-19 ENCOUNTER — Encounter: Payer: Self-pay | Admitting: Family Medicine

## 2018-04-24 ENCOUNTER — Other Ambulatory Visit (INDEPENDENT_AMBULATORY_CARE_PROVIDER_SITE_OTHER): Payer: Commercial Managed Care - PPO

## 2018-04-24 DIAGNOSIS — E782 Mixed hyperlipidemia: Secondary | ICD-10-CM | POA: Diagnosis not present

## 2018-04-24 LAB — LIPID PANEL
Cholesterol: 136 mg/dL (ref 0–200)
HDL: 50.1 mg/dL (ref >39.00)
LDL Cholesterol: 57 mg/dL (ref 0–99)
NonHDL: 85.8
Total CHOL/HDL Ratio: 3
Triglycerides: 142 mg/dL (ref 0.0–149.0)
VLDL: 28.4 mg/dL (ref 0.0–40.0)

## 2018-05-06 ENCOUNTER — Other Ambulatory Visit (HOSPITAL_COMMUNITY): Payer: Self-pay | Admitting: Cardiology

## 2018-06-19 ENCOUNTER — Other Ambulatory Visit: Payer: Self-pay

## 2018-06-19 ENCOUNTER — Encounter (HOSPITAL_COMMUNITY): Payer: Commercial Managed Care - PPO

## 2018-06-19 ENCOUNTER — Other Ambulatory Visit (HOSPITAL_COMMUNITY): Payer: Commercial Managed Care - PPO

## 2018-06-19 ENCOUNTER — Ambulatory Visit (HOSPITAL_COMMUNITY)
Admission: RE | Admit: 2018-06-19 | Discharge: 2018-06-19 | Disposition: A | Discharge status: Home / Self Care | Payer: Commercial Managed Care - PPO | Source: Ambulatory Visit | Attending: Cardiology | Admitting: Cardiology

## 2018-06-19 ENCOUNTER — Encounter (HOSPITAL_COMMUNITY): Payer: Self-pay

## 2018-06-19 DIAGNOSIS — I5022 Chronic systolic (congestive) heart failure: Secondary | ICD-10-CM

## 2018-06-19 DIAGNOSIS — I10 Essential (primary) hypertension: Secondary | ICD-10-CM

## 2018-06-19 DIAGNOSIS — I5021 Acute systolic (congestive) heart failure: Secondary | ICD-10-CM

## 2018-06-19 DIAGNOSIS — I483 Typical atrial flutter: Secondary | ICD-10-CM

## 2018-06-21 NOTE — Progress Notes (Signed)
Heart Failure TeleHealth Note  Due to national recommendations of social distancing due to [place] 19, Audio/video telehealth visit is felt to be most appropriate for this patient at this time.  See MyChart message from today for patient consent regarding telehealth for [facility].  Date:  06/21/2018   ID:  Devin [name]., DOB 1975-08-27, MRN [id number]  Location: Home  Provider location: [facility] Type of Visit: Established patient  PCP:  Shelda Pal, DO  Cardiologist:  Dr. Aundra Dubin  History of Present Illness: Devin [name]. is a 43 y.o. male who presents via audio/video conferencing for a telehealth visit today.     he denies symptoms worrisome for COVID 59.   43 y.o. with history of HTN, paroxysmal atrial flutter, and cardiomyopathy presents for followup of CHF.   Patient had history of untreated hypertension (diagnosed summer 2018) and hyperlipidemia.  He was admitted in 12/18 with epigastric pain, orthopnea, and dyspnea that had gone on for several days.  He did not free palpitations.  He was noted to be in atrial flutter with RVR and in CHF.  Prior to this episode, he had no exertional symptoms. Echo was done, showing EF 20-25% with moderately decreased RV systolic function.  Of note, prior to admission he was drinking up to 12 beers/day.  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  In the hospital, he was diuresed and cardioverted.  LHC/RHC was done in 1/19 with no coronary disease and normal filling pressures.   In 6/19, he had an atrial flutter ablation.   Cardiac MRI in 8/19 showed EF 40%, mild-moderate LV dilation, normal RV size and systolic function, no LGE.  No complaints today.  He has been doing well in general.  He has not had any palpitations.  No chest pain.  He is active and denies exertional dyspnea.  He continues to work full time.  No lightheadedness.  He says that he has cut back on ETOH.    Labs (10/18): HIV negative, normal Fe studies.  Labs (12/18): K 4, creatinine 1.35, hgb 14.7, decreased TSH, normal free T3 and free T4 Labs (1/19): K 4.3, creatinine 1.0 Labs (2/19): K 4.7, creatinine 0.99 Labs (3/19): K 4.1, creatinine 1.12 Labs (12/19): AST 43, ALT 84, K 4.7, creatinine 1.29 Labs (1/20): LDL 151, TGs 256, AST 42, ALT 68, K 3.9, creatinine 1.27 Labs (3/20): LDL 57, HDL 50, TGs 136  PMH: 1. HTN 2. Hyperlipidemia 3. ETOH abuse 4. Atrial flutter: Paroxysmal.  1st noted in 12/18.  - DCCV 12/18.  - Atrial flutter ablation in 6/19.  5. Chronic systolic CHF: Uncertain etiology.  HIV negative 10/18.  HTN, ETOH abuse could play a role.  Also consider tachy-mediated CMP in setting of atrial flutter with RVR.   - Echo (12/18): EF 20-25%, mild LV dilation, mild LVH, mild-moderate MR, moderately decreased RV systolic function, PASP 44 mmHg.  - TEE (12/18): EF 10-15%, moderate LV dilation, mild-moderate MR, moderately decreased RV systolic function.  - Echo (1/19): EF 25-30%, diffuse hypokinesis, moderate diastolic dysfunction, normal RV size and systolic function.  - LHC/RHC (1/19): No significant CAD, EF 30%; mean RA 6, PA 24/10, mean PCWP 9, CI 2.74 - Cardiac MRI (8/19): EF 40%, mild-moderate LV dilation, normal RV size and systolic function, no LGE.  Current Outpatient Medications  Medication Sig Dispense Refill   acetaminophen (TYLENOL) 500 MG tablet Take 1 tablet (500 mg total) by mouth  every 6 (six) hours as needed for mild pain (or Fever >/= 101). 1 tablet 0   atorvastatin (LIPITOR) 40 MG tablet Take 1 tablet (40 mg total) by mouth daily. 90 tablet 3   carvedilol (COREG) 12.5 MG tablet Take 1.5 tablets (18.75 mg total) by mouth 2 (two) times daily with a meal. 90 tablet 5   hydrOXYzine (ATARAX/VISTARIL) 50 MG tablet Take 1-2 tablets (50-100 mg total) by mouth 3 (three) times daily as needed for anxiety. (Patient not taking: Reported on 03/12/2018) 90 tablet 1    sacubitril-valsartan (ENTRESTO) 97-103 MG Take 1 tablet by mouth 2 (two) times daily. 60 tablet 3   spironolactone (ALDACTONE) 25 MG tablet TAKE 1 TABLET BY MOUTH AT BEDTIME 90 tablet 3   No current facility-administered medications for this encounter.     Allergies:   Patient has no known allergies.   Social History:  The patient  reports that he has quit smoking. His smoking use included cigarettes. He has a 8.80 pack-year smoking history. He has never used smokeless tobacco. He reports current alcohol use. He reports that he does not use drugs.   Family History:  The patient's family history includes Asthma in his son; Cancer in his maternal grandfather and paternal grandmother; Diabetes in his mother; Heart attack in his paternal grandfather; Hypertension in his father, mother, and sister.   ROS:  Please see the history of present illness.   All other systems are personally reviewed and negative.   Exam:  (Video/Tele Health Call; Exam is subjective and or/visual.) General:  Speaks in full sentences. No resp difficulty. Neck: No JVD.  Lungs: Normal respiratory effort with conversation.  Abdomen: Non-distended per patient report Extremities: Pt denies edema. Neuro: Alert & oriented x 3.   Recent Labs: 07/17/2017: Magnesium 2.0 01/26/2018: Hemoglobin 13.3; Platelets 261 03/12/2018: ALT 68; BUN 12; Creatinine, Ser 1.27; Potassium 3.9; Sodium 138  Personally reviewed   Wt Readings from Last 3 Encounters:  03/12/18 92.1 kg (203 lb)  02/03/18 92.7 kg (204 lb 6 oz)  01/26/18 91.6 kg (202 lb)      ASSESSMENT AND PLAN:  1. Chronic systolic CHF: Echo in 67/59 with EF 20-25%, moderately decreased RV systolic function.   Echo (1/19) with EF 25-30%. Possible causes are tachycardia-mediated CMP (in atrial flutter with RVR 12/18 admission), long-standing poorly controlled HTN, viral myocarditis, and heavy ETOH.  HIV negative, Fe studies negative.  LHC/RHC in 1/19 showed no CAD, preserved  cardiac output, and normal filling pressures.  With lack of improvement in EF on 1/19 echo, tachycardia-mediated CMP seems less likely. Cardiac MRI in 8/19 showed EF up to 40% with normal RV, no LGE.  I suspect that ETOH has played a role in his cardiomyopathy. NYHA class I symptoms, I do not think that he is volume overloaded.   - EF is out of ICD range.  - I will arrange for repeat echo.  - Continue Entresto 97/103 bid.    - Continue Coreg 18.75 mg bid.   - Continue spironolactone 25 daily.   - I will arrange for BMET.  - I encouraged him to keep ETOH minimal.  2. HTN: BP has been controlled.  3. ETOH abuse: He says that he has cut back on ETOH.  I will arrange to recheck LFTs, AST and ALT mildly elevated last check.     4. Atrial flutter: Paroxysmal.  I was concerned for tachy-mediated cardiomyopathy, but EF not significantly improved in 1/19 after > 1 month in  NSR.  He had atrial flutter ablation in 6/19.  No recent palpitations.   - He is now off apixaban.    COVID screen The patient does not have any symptoms that suggest any further testing/ screening at this time.  Social distancing reinforced today.  Patient Risk: After full review of this patients clinical status, I feel that they are at moderate risk for cardiac decompensation at this time.  Relevant cardiac medications were reviewed at length with the patient today. The patient does not have concerns regarding their medications at this time.   Recommended follow-up:  3 months  Today, I have spent 16 minutes with the patient with telehealth technology discussing the above issues .    Signed, Loralie Champagne, MD  06/21/2018  [place] [address] [facility] and [place] [address] [phone] (office) [phone] (fax)

## 2018-06-25 ENCOUNTER — Other Ambulatory Visit (HOSPITAL_COMMUNITY): Payer: Commercial Managed Care - PPO

## 2018-07-16 ENCOUNTER — Other Ambulatory Visit (HOSPITAL_COMMUNITY): Payer: Commercial Managed Care - PPO

## 2018-07-20 ENCOUNTER — Ambulatory Visit (HOSPITAL_COMMUNITY)
Admission: RE | Admit: 2018-07-20 | Discharge: 2018-07-20 | Disposition: A | Discharge status: Home / Self Care | Payer: Commercial Managed Care - PPO | Source: Ambulatory Visit | Attending: Cardiology | Admitting: Cardiology

## 2018-07-20 ENCOUNTER — Other Ambulatory Visit: Payer: Self-pay

## 2018-07-20 DIAGNOSIS — I5022 Chronic systolic (congestive) heart failure: Secondary | ICD-10-CM | POA: Diagnosis not present

## 2018-07-20 LAB — BASIC METABOLIC PANEL
Anion gap: 8 (ref 5–15)
BUN: 10 mg/dL (ref 6–20)
CO2: 26 mmol/L (ref 22–32)
Calcium: 9.4 mg/dL (ref 8.9–10.3)
Chloride: 104 mmol/L (ref 98–111)
Creatinine, Ser: 1.06 mg/dL (ref 0.61–1.24)
GFR calc Af Amer: >60 mL/min (ref >60)
GFR calc non Af Amer: >60 mL/min (ref >60)
Glucose, Bld: 109 mg/dL — ABNORMAL HIGH (ref 70–99)
Potassium: 4.8 mmol/L (ref 3.5–5.1)
Sodium: 138 mmol/L (ref 135–145)

## 2018-08-25 ENCOUNTER — Other Ambulatory Visit (HOSPITAL_COMMUNITY): Payer: Self-pay | Admitting: *Deleted

## 2018-08-25 ENCOUNTER — Ambulatory Visit (HOSPITAL_COMMUNITY)
Admission: RE | Admit: 2018-08-25 | Discharge: 2018-08-25 | Disposition: A | Discharge status: Home / Self Care | Payer: Commercial Managed Care - PPO | Source: Ambulatory Visit | Attending: Cardiology | Admitting: Cardiology

## 2018-08-25 ENCOUNTER — Other Ambulatory Visit: Payer: Self-pay

## 2018-08-25 VITALS — BP 134/80 | HR 74 | Wt 195.5 lb

## 2018-08-25 DIAGNOSIS — Z87891 Personal history of nicotine dependence: Secondary | ICD-10-CM | POA: Diagnosis not present

## 2018-08-25 DIAGNOSIS — I483 Typical atrial flutter: Secondary | ICD-10-CM | POA: Diagnosis not present

## 2018-08-25 DIAGNOSIS — I4892 Unspecified atrial flutter: Secondary | ICD-10-CM | POA: Diagnosis not present

## 2018-08-25 DIAGNOSIS — I5022 Chronic systolic (congestive) heart failure: Secondary | ICD-10-CM | POA: Insufficient documentation

## 2018-08-25 DIAGNOSIS — I110 Hypertensive heart disease with heart failure: Secondary | ICD-10-CM | POA: Insufficient documentation

## 2018-08-25 DIAGNOSIS — F1010 Alcohol abuse, uncomplicated: Secondary | ICD-10-CM | POA: Insufficient documentation

## 2018-08-25 DIAGNOSIS — Z8249 Family history of ischemic heart disease and other diseases of the circulatory system: Secondary | ICD-10-CM | POA: Diagnosis not present

## 2018-08-25 DIAGNOSIS — Z825 Family history of asthma and other chronic lower respiratory diseases: Secondary | ICD-10-CM | POA: Diagnosis not present

## 2018-08-25 DIAGNOSIS — I5021 Acute systolic (congestive) heart failure: Secondary | ICD-10-CM | POA: Diagnosis present

## 2018-08-25 DIAGNOSIS — I493 Ventricular premature depolarization: Secondary | ICD-10-CM | POA: Diagnosis not present

## 2018-08-25 DIAGNOSIS — E785 Hyperlipidemia, unspecified: Secondary | ICD-10-CM | POA: Insufficient documentation

## 2018-08-25 DIAGNOSIS — R002 Palpitations: Secondary | ICD-10-CM | POA: Insufficient documentation

## 2018-08-25 DIAGNOSIS — Z79899 Other long term (current) drug therapy: Secondary | ICD-10-CM | POA: Insufficient documentation

## 2018-08-25 DIAGNOSIS — Z833 Family history of diabetes mellitus: Secondary | ICD-10-CM | POA: Insufficient documentation

## 2018-08-25 LAB — MAGNESIUM: Magnesium: 2.3 mg/dL (ref 1.7–2.4)

## 2018-08-25 LAB — COMPREHENSIVE METABOLIC PANEL
ALT: 105 U/L — ABNORMAL HIGH (ref 0–44)
AST: 46 U/L — ABNORMAL HIGH (ref 15–41)
Albumin: 3.9 g/dL (ref 3.5–5.0)
Alkaline Phosphatase: 96 U/L (ref 38–126)
Anion gap: 9 (ref 5–15)
BUN: 11 mg/dL (ref 6–20)
CO2: 24 mmol/L (ref 22–32)
Calcium: 8.9 mg/dL (ref 8.9–10.3)
Chloride: 104 mmol/L (ref 98–111)
Creatinine, Ser: 1.03 mg/dL (ref 0.61–1.24)
GFR calc Af Amer: >60 mL/min (ref >60)
GFR calc non Af Amer: >60 mL/min (ref >60)
Glucose, Bld: 110 mg/dL — ABNORMAL HIGH (ref 70–99)
Potassium: 4.4 mmol/L (ref 3.5–5.1)
Sodium: 137 mmol/L (ref 135–145)
Total Bilirubin: 0.8 mg/dL (ref 0.3–1.2)
Total Protein: 7.4 g/dL (ref 6.5–8.1)

## 2018-08-25 MED ORDER — MAGNESIUM OXIDE 400 MG PO CAPS: 400.0000 mg | ORAL_CAPSULE | Freq: Every day | ORAL | 3 refills | Status: DC

## 2018-08-25 NOTE — Patient Instructions (Signed)
No alcohol or Tobacco for now to see if this helps palpitations.  Routine lab work today. Will notify you of abnormal results  Echo and follow up in 3 weeks.   Your provider requests you wear a Ziopatch for 3 days.  Take Magnesium Oxide 400mg  daily

## 2018-08-26 NOTE — Progress Notes (Signed)
Date:  08/26/2018   ID:  Devin Gourd., DOB 05-15-1975, MRN [id number]   Provider location: [facility] Type of Visit: Established patient  PCP:  Devin Pal, DO  Cardiologist:  Dr. Aundra [name]  History of Present Illness: Devin [name]. is a 43 y.o. male with history of HTN, paroxysmal atrial flutter, and cardiomyopathy who presents for followup of CHF.   Patient had history of untreated hypertension (diagnosed summer 2018) and hyperlipidemia.  He was admitted in 12/18 with epigastric pain, orthopnea, and dyspnea that had gone on for several days.  He did not free palpitations.  He was noted to be in atrial flutter with RVR and in CHF.  Prior to this episode, he had no exertional symptoms. Echo was done, showing EF 20-25% with moderately decreased RV systolic function.  Of note, prior to admission he was drinking up to 12 beers/day.  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  In the hospital, he was diuresed and cardioverted.  LHC/RHC was done in 1/19 with no coronary disease and normal filling pressures.   In 6/19, he had an atrial flutter ablation.   Cardiac MRI in 8/19 showed EF 40%, mild-moderate LV dilation, normal RV size and systolic function, no LGE.  He comes for a work-in visit today.  He has been feeling palpitations ("heart beats hard").  When he feels the palpitations, he will get lightheaded and short of breath.  They symptoms have been present for about a week and are quite bothersome.  No dyspnea or lightheadedness at other times.  Palpitations are not constant but are frequent.  By ECG today, he is in NSR with a PAC.  No chest pain.  He reports that he is smoking more (4-5 cigarettes/day) and also drinking more (3-4 drinks/day).  No coffee or energy drinks.  No new meds, no illicit drugs.   Labs (10/18): HIV negative, normal Fe studies.  Labs (12/18): K 4, creatinine 1.35, hgb 14.7, decreased TSH, normal free  T3 and free T4 Labs (1/19): K 4.3, creatinine 1.0 Labs (2/19): K 4.7, creatinine 0.99 Labs (3/19): K 4.1, creatinine 1.12 Labs (12/19): AST 43, ALT 84, K 4.7, creatinine 1.29 Labs (1/20): LDL 151, TGs 256, AST 42, ALT 68, K 3.9, creatinine 1.27 Labs (3/20): LDL 57, HDL 50, TGs 136 Labs (6/20): K 4.8, creatinine 1.06  PMH: 1. HTN 2. Hyperlipidemia 3. ETOH abuse 4. Atrial flutter: Paroxysmal.  1st noted in 12/18.  - DCCV 12/18.  - Atrial flutter ablation in 6/19.  5. Chronic systolic CHF: Uncertain etiology.  HIV negative 10/18.  HTN, ETOH abuse could play a role.  Also consider tachy-mediated CMP in setting of atrial flutter with RVR.   - Echo (12/18): EF 20-25%, mild LV dilation, mild LVH, mild-moderate MR, moderately decreased RV systolic function, PASP 44 mmHg.  - TEE (12/18): EF 10-15%, moderate LV dilation, mild-moderate MR, moderately decreased RV systolic function.  - Echo (1/19): EF 25-30%, diffuse hypokinesis, moderate diastolic dysfunction, normal RV size and systolic function.  - LHC/RHC (1/19): No significant CAD, EF 30%; mean RA 6, PA 24/10, mean PCWP 9, CI 2.74 - Cardiac MRI (8/19): EF 40%, mild-moderate LV dilation, normal RV size and systolic function, no LGE.  Current Outpatient Medications  Medication Sig Dispense Refill  . acetaminophen (TYLENOL) 500 MG tablet Take 1 tablet (500 mg total) by mouth every 6 (six) hours as needed for mild pain (or Fever >/=  101). 1 tablet 0  . atorvastatin (LIPITOR) 40 MG tablet Take 1 tablet (40 mg total) by mouth daily. 90 tablet 3  . carvedilol (COREG) 12.5 MG tablet Take 1.5 tablets (18.75 mg total) by mouth 2 (two) times daily with a meal. 90 tablet 5  . hydrOXYzine (ATARAX/VISTARIL) 50 MG tablet Take 1-2 tablets (50-100 mg total) by mouth 3 (three) times daily as needed for anxiety. 90 tablet 1  . sacubitril-valsartan (ENTRESTO) 97-103 MG Take 1 tablet by mouth 2 (two) times daily. 60 tablet 3  . spironolactone (ALDACTONE) 25 MG  tablet TAKE 1 TABLET BY MOUTH AT BEDTIME 90 tablet 3  . Magnesium Oxide 400 MG CAPS Take 1 capsule (400 mg total) by mouth daily. 30 capsule 3   No current facility-administered medications for this encounter.     Allergies:   Patient has no known allergies.   Social History:  The patient  reports that he has quit smoking. His smoking use included cigarettes. He has a 8.80 pack-year smoking history. He has never used smokeless tobacco. He reports current alcohol use. He reports that he does not use drugs.   Family History:  The patient's family history includes Asthma in his son; Cancer in his maternal grandfather and paternal grandmother; Diabetes in his mother; Heart attack in his paternal grandfather; Hypertension in his father, mother, and sister.   ROS:  Please see the history of present illness.   All other systems are personally reviewed and negative.   Exam:   BP 134/80   Pulse 74   Wt 88.7 kg (195 lb 8 oz)   SpO2 98%   BMI 26.51 kg/m  General: NAD Neck: No JVD, no thyromegaly or thyroid nodule.  Lungs: Clear to auscultation bilaterally with normal respiratory effort. CV: Nondisplaced PMI.  Heart regular S1/S2, no S3/S4, no murmur.  No peripheral edema.  No carotid bruit.  Normal pedal pulses.  Abdomen: Soft, nontender, no hepatosplenomegaly, no distention.  Skin: Intact without lesions or rashes.  Neurologic: Alert and oriented x 3.  Psych: Normal affect. Extremities: No clubbing or cyanosis.  HEENT: Normal.   Recent Labs: 01/26/2018: Hemoglobin 13.3; Platelets 261 08/25/2018: ALT 105; BUN 11; Creatinine, Ser 1.03; Magnesium 2.3; Potassium 4.4; Sodium 137  Personally reviewed   Wt Readings from Last 3 Encounters:  08/25/18 88.7 kg (195 lb 8 oz)  03/12/18 92.1 kg (203 lb)  02/03/18 92.7 kg (204 lb 6 oz)      ASSESSMENT AND PLAN:  1. Chronic systolic CHF: Echo in 19/41 with EF 20-25%, moderately decreased RV systolic function.   Echo (1/19) with EF 25-30%. Possible  causes are tachycardia-mediated CMP (in atrial flutter with RVR 12/18 admission), long-standing poorly controlled HTN, viral myocarditis, and heavy ETOH.  HIV negative, Fe studies negative.  LHC/RHC in 1/19 showed no CAD, preserved cardiac output, and normal filling pressures.  With lack of improvement in EF on 1/19 echo, tachycardia-mediated CMP seems less likely. Cardiac MRI in 8/19 showed EF up to 40% with normal RV, no LGE.  I suspect that ETOH has played a role in his cardiomyopathy.  NYHA class I except when he's having palpitations, then he feels short of breath and lightheaded.  He is not volume overloaded on exam.  - EF is out of ICD range.  - I will arrange for repeat echo => will try to get this week.  - Continue Entresto 97/103 bid.    - Increase Coreg to 25 mg bid with concern for possible  atrial arrhythmia.  - Continue spironolactone 25 daily.   - I encouraged him to keep ETOH minimal (see below).  2. Palpitations: Bothersome, have been occurring for about a week.  He is in NSR on ECG today with a PAC.  It is possible that frequent PACs are causing his symptoms, also could be due to atrial fibrillation or flutter.  - I will have him wear a Zio patch for 3 days to assess rhythm.  He has symptoms daily.  - I asked him to stop smoking and ETOH for at least 1 week to see if abstinence from nicotine and ETOH will help with symptoms.  He says that he has been smoking and drinking more recently.  - Check K and Mg.  3. HTN: BP has been controlled.  4. ETOH abuse: He seems to be drinking more again, reports 3-4 drinks/night.  Was heavier in the remote past.   - As above, stop ETOH for at least a week to see if it helps lessen palpitations.      5. Atrial flutter: Paroxysmal.  I was concerned for tachy-mediated cardiomyopathy, but EF not significantly improved in 1/19 after > 1 month in NSR. He had atrial flutter ablation in 6/19.   - He is now off apixaban.   - As above, wearing Zio patch x 3  days.   Signed, Loralie Champagne, MD  08/26/2018  [facility] [address] [facility] and [place] [address] [phone] (office) [phone] (fax)

## 2018-09-01 ENCOUNTER — Telehealth (HOSPITAL_COMMUNITY): Payer: Self-pay

## 2018-09-01 NOTE — Telephone Encounter (Signed)
-----   Message from Larey Dresser, MD sent at 08/25/2018  1:04 PM EDT ----- Elevated LFTs suggestive of excess ETOH but should have viral hepatitis labs drawn => anti-HCV, HBsAg, HBsAb, HBcAB.

## 2018-09-01 NOTE — Telephone Encounter (Signed)
Spoke with patient, aware of lab results and recommendations for additional labs. Per Dr. Aundra Dubin, can be done when he returns to clinic 7/29. Pt aware of same verbalized understanding.

## 2018-09-15 ENCOUNTER — Other Ambulatory Visit (HOSPITAL_COMMUNITY): Payer: Self-pay

## 2018-09-15 MED ORDER — CARVEDILOL 12.5 MG PO TABS: 18.7500 mg | ORAL_TABLET | Freq: Two times a day (BID) | ORAL | 5 refills | Status: DC

## 2018-09-16 ENCOUNTER — Ambulatory Visit (HOSPITAL_BASED_OUTPATIENT_CLINIC_OR_DEPARTMENT_OTHER)
Admission: RE | Admit: 2018-09-16 | Discharge: 2018-09-16 | Disposition: A | Discharge status: Home / Self Care | Payer: Commercial Managed Care - PPO | Source: Ambulatory Visit | Attending: Family Medicine | Admitting: Family Medicine

## 2018-09-16 ENCOUNTER — Encounter (HOSPITAL_COMMUNITY): Payer: Self-pay | Admitting: Cardiology

## 2018-09-16 ENCOUNTER — Other Ambulatory Visit: Payer: Self-pay

## 2018-09-16 ENCOUNTER — Ambulatory Visit (HOSPITAL_COMMUNITY)
Admission: RE | Admit: 2018-09-16 | Discharge: 2018-09-16 | Disposition: A | Discharge status: Home / Self Care | Payer: Commercial Managed Care - PPO | Source: Ambulatory Visit | Attending: Cardiology | Admitting: Cardiology

## 2018-09-16 VITALS — BP 112/68 | HR 69 | Wt 196.8 lb

## 2018-09-16 DIAGNOSIS — E785 Hyperlipidemia, unspecified: Secondary | ICD-10-CM | POA: Insufficient documentation

## 2018-09-16 DIAGNOSIS — R945 Abnormal results of liver function studies: Secondary | ICD-10-CM | POA: Diagnosis not present

## 2018-09-16 DIAGNOSIS — F1010 Alcohol abuse, uncomplicated: Secondary | ICD-10-CM | POA: Diagnosis not present

## 2018-09-16 DIAGNOSIS — I5021 Acute systolic (congestive) heart failure: Secondary | ICD-10-CM

## 2018-09-16 DIAGNOSIS — I4892 Unspecified atrial flutter: Secondary | ICD-10-CM | POA: Diagnosis not present

## 2018-09-16 DIAGNOSIS — I5022 Chronic systolic (congestive) heart failure: Secondary | ICD-10-CM | POA: Diagnosis present

## 2018-09-16 DIAGNOSIS — I4891 Unspecified atrial fibrillation: Secondary | ICD-10-CM | POA: Diagnosis not present

## 2018-09-16 DIAGNOSIS — R7989 Other specified abnormal findings of blood chemistry: Secondary | ICD-10-CM

## 2018-09-16 DIAGNOSIS — Z79899 Other long term (current) drug therapy: Secondary | ICD-10-CM | POA: Insufficient documentation

## 2018-09-16 DIAGNOSIS — I483 Typical atrial flutter: Secondary | ICD-10-CM

## 2018-09-16 DIAGNOSIS — I110 Hypertensive heart disease with heart failure: Secondary | ICD-10-CM | POA: Diagnosis not present

## 2018-09-16 DIAGNOSIS — Z87891 Personal history of nicotine dependence: Secondary | ICD-10-CM | POA: Insufficient documentation

## 2018-09-16 DIAGNOSIS — R002 Palpitations: Secondary | ICD-10-CM | POA: Diagnosis not present

## 2018-09-16 DIAGNOSIS — I10 Essential (primary) hypertension: Secondary | ICD-10-CM | POA: Diagnosis not present

## 2018-09-16 DIAGNOSIS — Z8249 Family history of ischemic heart disease and other diseases of the circulatory system: Secondary | ICD-10-CM | POA: Diagnosis not present

## 2018-09-16 DIAGNOSIS — Z7901 Long term (current) use of anticoagulants: Secondary | ICD-10-CM | POA: Insufficient documentation

## 2018-09-16 DIAGNOSIS — I480 Paroxysmal atrial fibrillation: Secondary | ICD-10-CM

## 2018-09-16 MED ORDER — APIXABAN 5 MG PO TABS: 5.0000 mg | ORAL_TABLET | Freq: Two times a day (BID) | ORAL | 6 refills | Status: DC

## 2018-09-16 NOTE — Progress Notes (Signed)
  Echocardiogram 2D Echocardiogram has been performed.  Devin [name] 09/16/2018, 9:34 AM

## 2018-09-16 NOTE — Patient Instructions (Addendum)
Start Eliquis 5mg  (1 tab) twice a day.  Labs today We will only contact you if something comes back abnormal or we need to make some changes. Otherwise no news is good news!  Your physician has requested that you have an echocardiogram in 6 months. Echocardiography is a painless test that uses sound waves to create images of your heart. It provides your doctor with information about the size and shape of your heart and how well your heart's chambers and valves are working. This procedure takes approximately one hour. There are no restrictions for this procedure.  Your physician recommends that you schedule a follow-up appointment in: 3 months with Dr Aundra Dubin  At the [facility], you and your health needs are our priority. As part of our continuing mission to provide you with exceptional heart care, we have created designated Provider Care Teams. These Care Teams include your primary Cardiologist (physician) and Advanced Practice Providers (APPs- Physician Assistants and Nurse Practitioners) who all work together to provide you with the care you need, when you need it.   You may see any of the following providers on your designated Care Team at your next follow up: Marland Kitchen Dr Glori Bickers . Dr Loralie Champagne . Darrick Grinder, NP

## 2018-09-17 LAB — HEPATITIS C ANTIBODY: HCV Ab: <0.1 s/co ratio (ref 0.0–0.9)

## 2018-09-17 LAB — HEPATITIS B CORE ANTIBODY, TOTAL: Hep B Core Total Ab: NEGATIVE

## 2018-09-17 LAB — HEPATITIS B SURFACE ANTIGEN: Hepatitis B Surface Ag: NEGATIVE

## 2018-09-17 LAB — HEPATITIS B SURFACE ANTIBODY,QUALITATIVE: Hep B S Ab: NONREACTIVE

## 2018-09-17 LAB — HEPATITIS A ANTIBODY, TOTAL: hep A Total Ab: NEGATIVE

## 2018-09-17 NOTE — Progress Notes (Signed)
Date:  09/17/2018   ID:  [name], DOB 1975/11/19, MRN [id number]   Provider location: [facility] Type of Visit: Established patient  PCP:  Shelda Pal, DO  Cardiologist:  Dr. Aundra Dubin  History of Present Illness: Devin [name]. is a 43 y.o. male with history of HTN, paroxysmal atrial flutter, and cardiomyopathy who presents for followup of CHF.   Patient had history of untreated hypertension (diagnosed summer 2018) and hyperlipidemia.  He was admitted in 12/18 with epigastric pain, orthopnea, and dyspnea that had gone on for several days.  He did not free palpitations.  He was noted to be in atrial flutter with RVR and in CHF.  Prior to this episode, he had no exertional symptoms. Echo was done, showing EF 20-25% with moderately decreased RV systolic function.  Of note, prior to admission he was drinking up to 12 beers/day.  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  In the hospital, he was diuresed and cardioverted.  LHC/RHC was done in 1/19 with no coronary disease and normal filling pressures.   In 6/19, he had an atrial flutter ablation.   Cardiac MRI in 8/19 showed EF 40%, mild-moderate LV dilation, normal RV size and systolic function, no LGE.  Prior to last appt, he had been feeling palpitations ("heart beats hard").  When he feels the palpitations, he will get lightheaded and short of breath.  He reported that he was smoking more (4-5 cigarettes/day) and also drinking more (3-4 drinks/day).  No coffee or energy drinks.  No new meds, no illicit drugs.   I asked him to stop drinking completely as well as stop smoking as his symptoms sounded arrhythmic (atrial fibrillation, PACs).  I had him get a repeat echo, this was reviewed today and showed EF 40-45% with normal diastolic function and normal RV size and systolic function.  3 day monitor showed several short runs of atrial fibrillation.   He reports that he is  feeling better.  He has cut back considerably on both drinking and smoking, and palpitations have mostly resolved.  No exertional dyspnea or chest pain.  No lightheadedness.   Labs (10/18): HIV negative, normal Fe studies.  Labs (12/18): K 4, creatinine 1.35, hgb 14.7, decreased TSH, normal free T3 and free T4 Labs (1/19): K 4.3, creatinine 1.0 Labs (2/19): K 4.7, creatinine 0.99 Labs (3/19): K 4.1, creatinine 1.12 Labs (12/19): AST 43, ALT 84, K 4.7, creatinine 1.29 Labs (1/20): LDL 151, TGs 256, AST 42, ALT 68, K 3.9, creatinine 1.27 Labs (3/20): LDL 57, HDL 50, TGs 136 Labs (6/20): K 4.8, creatinine 1.06 Labs (7/20): K 4.4, creatinine 1.03, AST 46, ALT 105, Mg 2.3  PMH: 1. HTN 2. Hyperlipidemia 3. ETOH abuse 4. Atrial flutter: Paroxysmal.  1st noted in 12/18.  - DCCV 12/18.  - Atrial flutter ablation in 6/19.  5. Chronic systolic CHF: Uncertain etiology.  HIV negative 10/18.  HTN, ETOH abuse could play a role.  Also consider tachy-mediated CMP in setting of atrial flutter with RVR.   - Echo (12/18): EF 20-25%, mild LV dilation, mild LVH, mild-moderate MR, moderately decreased RV systolic function, PASP 44 mmHg.  - TEE (12/18): EF 10-15%, moderate LV dilation, mild-moderate MR, moderately decreased RV systolic function.  - Echo (1/19): EF 25-30%, diffuse hypokinesis, moderate diastolic dysfunction, normal RV size and systolic function.  - LHC/RHC (1/19): No significant CAD, EF 30%; mean RA 6, PA  24/10, mean PCWP 9, CI 2.74 - Cardiac MRI (8/19): EF 40%, mild-moderate LV dilation, normal RV size and systolic function, no LGE.  Current Outpatient Medications  Medication Sig Dispense Refill  . acetaminophen (TYLENOL) 500 MG tablet Take 1 tablet (500 mg total) by mouth every 6 (six) hours as needed for mild pain (or Fever >/= 101). 1 tablet 0  . atorvastatin (LIPITOR) 40 MG tablet Take 1 tablet (40 mg total) by mouth daily. 90 tablet 3  . carvedilol (COREG) 12.5 MG tablet Take 1.5  tablets (18.75 mg total) by mouth 2 (two) times daily with a meal. 90 tablet 5  . hydrOXYzine (ATARAX/VISTARIL) 50 MG tablet Take 1-2 tablets (50-100 mg total) by mouth 3 (three) times daily as needed for anxiety. 90 tablet 1  . Magnesium Oxide 400 MG CAPS Take 1 capsule (400 mg total) by mouth daily. 30 capsule 3  . sacubitril-valsartan (ENTRESTO) 97-103 MG Take 1 tablet by mouth 2 (two) times daily. 60 tablet 3  . spironolactone (ALDACTONE) 25 MG tablet TAKE 1 TABLET BY MOUTH AT BEDTIME 90 tablet 3  . apixaban (ELIQUIS) 5 MG TABS tablet Take 1 tablet (5 mg total) by mouth 2 (two) times daily. 60 tablet 6   No current facility-administered medications for this encounter.     Allergies:   Patient has no known allergies.   Social History:  The patient  reports that he has quit smoking. His smoking use included cigarettes. He has a 8.80 pack-year smoking history. He has never used smokeless tobacco. He reports current alcohol use. He reports that he does not use drugs.   Family History:  The patient's family history includes Asthma in his son; Cancer in his maternal grandfather and paternal grandmother; Diabetes in his mother; Heart attack in his paternal grandfather; Hypertension in his father, mother, and sister.   ROS:  Please see the history of present illness.   All other systems are personally reviewed and negative.   Exam:   BP 112/68   Pulse 69   Wt 89.3 kg (196 lb 12.8 oz)   SpO2 98%   BMI 26.69 kg/m  General: NAD Neck: No JVD, no thyromegaly or thyroid nodule.  Lungs: Clear to auscultation bilaterally with normal respiratory effort. CV: Nondisplaced PMI.  Heart regular S1/S2, no S3/S4, no murmur.  No peripheral edema.  No carotid bruit.  Normal pedal pulses.  Abdomen: Soft, nontender, no hepatosplenomegaly, no distention.  Skin: Intact without lesions or rashes.  Neurologic: Alert and oriented x 3.  Psych: Normal affect. Extremities: No clubbing or cyanosis.  HEENT: Normal.    Recent Labs: 01/26/2018: Hemoglobin 13.3; Platelets 261 08/25/2018: ALT 105; BUN 11; Creatinine, Ser 1.03; Magnesium 2.3; Potassium 4.4; Sodium 137  Personally reviewed   Wt Readings from Last 3 Encounters:  09/16/18 89.3 kg (196 lb 12.8 oz)  08/25/18 88.7 kg (195 lb 8 oz)  03/12/18 92.1 kg (203 lb)      ASSESSMENT AND PLAN:  1. Chronic systolic CHF: Echo in 84/16 with EF 20-25%, moderately decreased RV systolic function.   Echo (1/19) with EF 25-30%. Possible causes are tachycardia-mediated CMP (in atrial flutter with RVR 12/18 admission), long-standing poorly controlled HTN, viral myocarditis, and heavy ETOH.  HIV negative, Fe studies negative.  LHC/RHC in 1/19 showed no CAD, preserved cardiac output, and normal filling pressures.  With lack of improvement in EF on 1/19 echo, tachycardia-mediated CMP seems less likely. Cardiac MRI in 8/19 showed EF up to 40% with normal RV,  no LGE.  I suspect that ETOH has played a role in his cardiomyopathy.  NYHA class I except when he's having palpitations, then he feels short of breath and lightheaded.  He is not volume overloaded on exam.  Echo was done today and reviewed, EF remains 40%.  - EF is out of ICD range.  - Continue Entresto 97/103 bid.    - Continue Coreg 18.75 mg bid.   - Continue spironolactone 25 daily.   - I encouraged him to continue to keep ETOH minimal (see below).  2. Palpitations: 3 day monitor in 7/20 showed several short atrial fibrillation runs.  I suspect this may have been what was causing his palpitations.  I asked him to stop drinking ETOH and smoking, and he says that this has helped in terms of the palpitations.   - I would like him to continue to remain abstinent from ETOH.  - CHADSVASC 1 (congestive heart failure/cardiomyopathy).  I asked him to restart Eliquis (had been on this in the past with atrial flutter).  I will repeat an echo in 6 months and would consider stopping Eliquis if he is in NSR and EF is near normal.    3. HTN: BP has been controlled.  4. ETOH abuse: He has been drinking more.  I told him that I thought it was imperative that he stop given cardiomyopathy, elevated LFTs, and atrial fibrillation. He is having less palpitations since he cut back on ETOH.  5. Atrial flutter: Paroxysmal.  I was concerned for tachy-mediated cardiomyopathy, but EF not significantly improved in 1/19 after > 1 month in NSR. He had atrial flutter ablation in 6/19.   6. 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   - Will send labs for viral hepatides.   Signed, Loralie Champagne, MD  09/17/2018  [place] [address] Heart and [place] [address] [phone] (office) [phone] (fax)

## 2018-09-18 ENCOUNTER — Encounter (HOSPITAL_COMMUNITY): Payer: Self-pay

## 2018-09-21 ENCOUNTER — Encounter (HOSPITAL_COMMUNITY): Payer: Self-pay

## 2018-10-05 ENCOUNTER — Other Ambulatory Visit (HOSPITAL_COMMUNITY): Payer: Self-pay | Admitting: Cardiology

## 2018-10-23 ENCOUNTER — Other Ambulatory Visit (HOSPITAL_COMMUNITY): Payer: Commercial Managed Care - PPO

## 2018-10-23 ENCOUNTER — Encounter (HOSPITAL_COMMUNITY): Payer: Commercial Managed Care - PPO | Admitting: Cardiology

## 2018-11-23 ENCOUNTER — Encounter (HOSPITAL_COMMUNITY): Payer: Self-pay

## 2018-11-23 ENCOUNTER — Telehealth (HOSPITAL_COMMUNITY): Payer: Self-pay | Admitting: *Deleted

## 2018-11-23 NOTE — Telephone Encounter (Signed)
pts wife left VM stating the pharmacy told her patient would need a PA for entresto.  Message routed to [facility]

## 2018-11-24 ENCOUNTER — Telehealth (HOSPITAL_COMMUNITY): Payer: Self-pay | Admitting: Pharmacy Technician

## 2018-11-24 NOTE — Telephone Encounter (Signed)
Patient Advocate Encounter   Received notification from [facility] that prior authorization for Entresto 97-103mg  is required.   PA submitted on CoverMyMeds Key [id number] Status is pending   Will continue to follow.  Charlann Boxer, CPhT

## 2018-11-24 NOTE — Telephone Encounter (Signed)
Advanced Heart Failure Patient Advocate Encounter  Prior Authorization for Entresto 97-103mg  has been approved.    PA# [id number] Effective dates: 11/24/2018 through 11/23/2021  Patients co-pay is $125.00 for 90 day supply  Patients insurance requires the patient to either fill a 90 day supply through a local [facility] or get a 90 day supply through [facility] mail order [facility].   Patient already had a co-pay card started with [facility] [id number]  PCN [id number]  GROUP [id number]  ID [id number]  Has not used card yet and has $3250 left to use. Called patient to see which pharmacy he would like his script sent to, lvm.  Will follow up  Charlann Boxer, CPhT

## 2018-11-27 ENCOUNTER — Other Ambulatory Visit (HOSPITAL_COMMUNITY): Payer: Self-pay | Admitting: Cardiology

## 2018-12-03 NOTE — Telephone Encounter (Signed)
Have not heard from patient. Called to check and make sure everything was going ok with his co-pay card. Left voicemail.  Charlann Boxer, CPhT

## 2018-12-18 ENCOUNTER — Other Ambulatory Visit: Payer: Self-pay

## 2018-12-18 ENCOUNTER — Ambulatory Visit (HOSPITAL_COMMUNITY)
Admission: RE | Admit: 2018-12-18 | Discharge: 2018-12-18 | Disposition: A | Discharge status: Home / Self Care | Payer: Self-pay | Source: Ambulatory Visit | Attending: Cardiology | Admitting: Cardiology

## 2018-12-18 VITALS — BP 132/89 | HR 75 | Wt 198.8 lb

## 2018-12-18 DIAGNOSIS — Z833 Family history of diabetes mellitus: Secondary | ICD-10-CM | POA: Insufficient documentation

## 2018-12-18 DIAGNOSIS — E785 Hyperlipidemia, unspecified: Secondary | ICD-10-CM | POA: Insufficient documentation

## 2018-12-18 DIAGNOSIS — Z79899 Other long term (current) drug therapy: Secondary | ICD-10-CM | POA: Insufficient documentation

## 2018-12-18 DIAGNOSIS — I429 Cardiomyopathy, unspecified: Secondary | ICD-10-CM | POA: Insufficient documentation

## 2018-12-18 DIAGNOSIS — I4892 Unspecified atrial flutter: Secondary | ICD-10-CM | POA: Insufficient documentation

## 2018-12-18 DIAGNOSIS — I110 Hypertensive heart disease with heart failure: Secondary | ICD-10-CM | POA: Insufficient documentation

## 2018-12-18 DIAGNOSIS — Z87891 Personal history of nicotine dependence: Secondary | ICD-10-CM | POA: Insufficient documentation

## 2018-12-18 DIAGNOSIS — Z809 Family history of malignant neoplasm, unspecified: Secondary | ICD-10-CM | POA: Insufficient documentation

## 2018-12-18 DIAGNOSIS — F1021 Alcohol dependence, in remission: Secondary | ICD-10-CM | POA: Insufficient documentation

## 2018-12-18 DIAGNOSIS — R002 Palpitations: Secondary | ICD-10-CM | POA: Insufficient documentation

## 2018-12-18 DIAGNOSIS — I5022 Chronic systolic (congestive) heart failure: Secondary | ICD-10-CM | POA: Insufficient documentation

## 2018-12-18 DIAGNOSIS — Z8249 Family history of ischemic heart disease and other diseases of the circulatory system: Secondary | ICD-10-CM | POA: Insufficient documentation

## 2018-12-18 DIAGNOSIS — I4891 Unspecified atrial fibrillation: Secondary | ICD-10-CM | POA: Insufficient documentation

## 2018-12-18 DIAGNOSIS — I480 Paroxysmal atrial fibrillation: Secondary | ICD-10-CM

## 2018-12-18 DIAGNOSIS — Z7901 Long term (current) use of anticoagulants: Secondary | ICD-10-CM | POA: Insufficient documentation

## 2018-12-18 LAB — CBC
HCT: 45 % (ref 39.0–52.0)
Hemoglobin: 14.5 g/dL (ref 13.0–17.0)
MCH: 29.4 pg (ref 26.0–34.0)
MCHC: 32.2 g/dL (ref 30.0–36.0)
MCV: 91.1 fL (ref 80.0–100.0)
Platelets: 273 10*3/uL (ref 150–400)
RBC: 4.94 MIL/uL (ref 4.22–5.81)
RDW: 14.9 % (ref 11.5–15.5)
WBC: 6.9 10*3/uL (ref 4.0–10.5)
nRBC: 0 % (ref 0.0–0.2)

## 2018-12-18 LAB — COMPREHENSIVE METABOLIC PANEL
ALT: 65 U/L — ABNORMAL HIGH (ref 0–44)
AST: 37 U/L (ref 15–41)
Albumin: 3.7 g/dL (ref 3.5–5.0)
Alkaline Phosphatase: 89 U/L (ref 38–126)
Anion gap: 10 (ref 5–15)
BUN: 8 mg/dL (ref 6–20)
CO2: 24 mmol/L (ref 22–32)
Calcium: 8.9 mg/dL (ref 8.9–10.3)
Chloride: 108 mmol/L (ref 98–111)
Creatinine, Ser: 1.03 mg/dL (ref 0.61–1.24)
GFR calc Af Amer: >60 mL/min (ref >60)
GFR calc non Af Amer: >60 mL/min (ref >60)
Glucose, Bld: 104 mg/dL — ABNORMAL HIGH (ref 70–99)
Potassium: 4.6 mmol/L (ref 3.5–5.1)
Sodium: 142 mmol/L (ref 135–145)
Total Bilirubin: 0.5 mg/dL (ref 0.3–1.2)
Total Protein: 6.8 g/dL (ref 6.5–8.1)

## 2018-12-18 MED ORDER — SPIRONOLACTONE 25 MG PO TABS: 25.0000 mg | ORAL_TABLET | Freq: Every day | ORAL | 6 refills | Status: DC

## 2018-12-18 MED ORDER — CARVEDILOL 12.5 MG PO TABS: 18.7500 mg | ORAL_TABLET | Freq: Two times a day (BID) | ORAL | 3 refills | Status: DC

## 2018-12-18 NOTE — Patient Instructions (Signed)
INCREASE Coreg to 18.75mg  (1.5 tabs) twice a day  INCREASE Spironolactone to 25mg  (1 tab)   Labs today We will only contact you if something comes back abnormal or we need to make some changes. Otherwise no news is good news!  Your physician recommends that you schedule a follow-up appointment in: 3 months with Dr Aundra Dubin  At the [facility], you and your health needs are our priority. As part of our continuing mission to provide you with exceptional heart care, we have created designated Provider Care Teams. These Care Teams include your primary Cardiologist (physician) and Advanced Practice Providers (APPs- Physician Assistants and Nurse Practitioners) who all work together to provide you with the care you need, when you need it.   You may see any of the following providers on your designated Care Team at your next follow up: Marland Kitchen Dr Glori Bickers . Dr Loralie Champagne . Darrick Grinder, NP . Lyda Jester, PA   Please be sure to bring in all your medications bottles to every appointment.

## 2018-12-19 NOTE — Progress Notes (Signed)
Date:  12/19/2018   ID:  Devin Gourd., DOB 1975/08/06, MRN [id number]   Provider location: [facility] Type of Visit: Established patient  PCP:  Devin Pal, DO  Cardiologist:  Dr. Aundra Dubin  History of Present Illness: Devin [name]. is a 43 y.o. male with history of HTN, paroxysmal atrial flutter, and cardiomyopathy who presents for followup of CHF.   Patient had history of untreated hypertension (diagnosed summer 2018) and hyperlipidemia.  He was admitted in 12/18 with epigastric pain, orthopnea, and dyspnea that had gone on for several days.  He did not free palpitations.  He was noted to be in atrial flutter with RVR and in CHF.  Prior to this episode, he had no exertional symptoms. Echo was done, showing EF 20-25% with moderately decreased RV systolic function.  Of note, prior to admission he was drinking up to 12 beers/day.  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  In the hospital, he was diuresed and cardioverted.  LHC/RHC was done in 1/19 with no coronary disease and normal filling pressures.   In 6/19, he had an atrial flutter ablation.   Cardiac MRI in 8/19 showed EF 40%, mild-moderate LV dilation, normal RV size and systolic function, no LGE.  Prior to last appt, he had been feeling palpitations ("heart beats hard").  When he feels the palpitations, he will get lightheaded and short of breath.  He reported that he was smoking more (4-5 cigarettes/day) and also drinking more (3-4 drinks/day).  No coffee or energy drinks.  No new meds, no illicit drugs.   I asked him to stop drinking completely as well as stop smoking as his symptoms sounded arrhythmic (atrial fibrillation, PACs).  I had him get a repeat echo in 7/20, showing EF 40-45% with normal diastolic function and normal RV size and systolic function.  3 day monitor showed several short runs of atrial fibrillation.   He has been doing well recently.   Palpitations are improved, he gets lightheaded when he does have palpitations (does not pass out).  He is very active, no exertional dyspnea.  No orthopnea/PND. No chest pain.  No BRBPR.   Labs (10/18): HIV negative, normal Fe studies.  Labs (12/18): K 4, creatinine 1.35, hgb 14.7, decreased TSH, normal free T3 and free T4 Labs (1/19): K 4.3, creatinine 1.0 Labs (2/19): K 4.7, creatinine 0.99 Labs (3/19): K 4.1, creatinine 1.12 Labs (12/19): AST 43, ALT 84, K 4.7, creatinine 1.29 Labs (1/20): LDL 151, TGs 256, AST 42, ALT 68, K 3.9, creatinine 1.27 Labs (3/20): LDL 57, HDL 50, TGs 136 Labs (6/20): K 4.8, creatinine 1.06 Labs (7/20): K 4.4, creatinine 1.03, AST 46, ALT 105, Mg 2.3, HCV negative, HBV negative  PMH: 1. HTN 2. Hyperlipidemia 3. ETOH abuse 4. Atrial flutter: Paroxysmal.  1st noted in 12/18.  - DCCV 12/18.  - Atrial flutter ablation in 6/19.  5. Chronic systolic CHF: Uncertain etiology.  HIV negative 10/18.  HTN, ETOH abuse could play a role.  Also consider tachy-mediated CMP in setting of atrial flutter with RVR.   - Echo (12/18): EF 20-25%, mild LV dilation, mild LVH, mild-moderate MR, moderately decreased RV systolic function, PASP 44 mmHg.  - TEE (12/18): EF 10-15%, moderate LV dilation, mild-moderate MR, moderately decreased RV systolic function.  - Echo (1/19): EF 25-30%, diffuse hypokinesis, moderate diastolic dysfunction, normal RV size and systolic function.  - LHC/RHC (1/19): No significant  CAD, EF 30%; mean RA 6, PA 24/10, mean PCWP 9, CI 2.74 - Cardiac MRI (8/19): EF 40%, mild-moderate LV dilation, normal RV size and systolic function, no LGE. - Echo (7/20): EF 40-45% with normal diastolic function and normal RV size and systolic function.   Current Outpatient Medications  Medication Sig Dispense Refill  . acetaminophen (TYLENOL) 500 MG tablet Take 1 tablet (500 mg total) by mouth every 6 (six) hours as needed for mild pain (or Fever >/= 101). 1 tablet 0  .  apixaban (ELIQUIS) 5 MG TABS tablet Take 1 tablet (5 mg total) by mouth 2 (two) times daily. 60 tablet 6  . atorvastatin (LIPITOR) 40 MG tablet Take 1 tablet (40 mg total) by mouth daily. 90 tablet 3  . carvedilol (COREG) 12.5 MG tablet Take 1.5 tablets (18.75 mg total) by mouth 2 (two) times daily with a meal. 90 tablet 3  . ENTRESTO 97-103 MG TAKE 1 TABLET BY MOUTH TWICE DAILY 60 tablet 11  . hydrOXYzine (ATARAX/VISTARIL) 50 MG tablet Take 1-2 tablets (50-100 mg total) by mouth 3 (three) times daily as needed for anxiety. 90 tablet 1  . Magnesium Oxide 400 MG CAPS Take 1 capsule (400 mg total) by mouth daily. 30 capsule 3  . spironolactone (ALDACTONE) 25 MG tablet Take 1 tablet (25 mg total) by mouth daily. 30 tablet 6   No current facility-administered medications for this encounter.     Allergies:   Patient has no known allergies.   Social History:  The patient  reports that he has quit smoking. His smoking use included cigarettes. He has a 8.80 pack-year smoking history. He has never used smokeless tobacco. He reports current alcohol use. He reports that he does not use drugs.   Family History:  The patient's family history includes Asthma in his son; Cancer in his maternal grandfather and paternal grandmother; Diabetes in his mother; Heart attack in his paternal grandfather; Hypertension in his father, mother, and sister.   ROS:  Please see the history of present illness.   All other systems are personally reviewed and negative.   Exam:   BP 132/89   Pulse 75   Wt 90.2 kg (198 lb 12.8 oz)   SpO2 97%   BMI 26.96 kg/m  General: NAD Neck: No JVD, no thyromegaly or thyroid nodule.  Lungs: Clear to auscultation bilaterally with normal respiratory effort. CV: Nondisplaced PMI.  Heart regular S1/S2, no S3/S4, no murmur.  No peripheral edema.  No carotid bruit.  Normal pedal pulses.  Abdomen: Soft, nontender, no hepatosplenomegaly, no distention.  Skin: Intact without lesions or rashes.   Neurologic: Alert and oriented x 3.  Psych: Normal affect. Extremities: No clubbing or cyanosis.  HEENT: Normal.   Recent Labs: 08/25/2018: Magnesium 2.3 12/18/2018: ALT 65; BUN 8; Creatinine, Ser 1.03; Hemoglobin 14.5; Platelets 273; Potassium 4.6; Sodium 142  Personally reviewed   Wt Readings from Last 3 Encounters:  12/18/18 90.2 kg (198 lb 12.8 oz)  09/16/18 89.3 kg (196 lb 12.8 oz)  08/25/18 88.7 kg (195 lb 8 oz)      ASSESSMENT AND PLAN:  1. Chronic systolic CHF: Echo in [id number] with EF 20-25%, moderately decreased RV systolic function.   Echo (1/19) with EF 25-30%. Possible causes are tachycardia-mediated CMP (in atrial flutter with RVR 12/18 admission), long-standing poorly controlled HTN, viral myocarditis, and heavy ETOH.  HIV negative, Fe studies negative.  LHC/RHC in 1/19 showed no CAD, preserved cardiac output, and normal filling pressures.  With  lack of improvement in EF on 1/19 echo, tachycardia-mediated CMP seems less likely. Cardiac MRI in 8/19 showed EF up to 40% with normal RV, no LGE. Last echo in 7/20 showed EF 40-45%.  I suspect that ETOH has played a role in his cardiomyopathy.  NYHA class I except when he's having palpitations, then he feels short of breath and lightheaded. He is not volume overloaded on exam.  - EF is out of ICD range.  - Continue Entresto 97/103 bid.    - Increase Coreg to 18.75 mg bid.   - Increase spironolactone to 25 daily.  BMET today and again in 10 days.  - I encouraged him to continue to keep ETOH minimal (see below).  2. Palpitations: 3 day monitor in 7/20 showed several short atrial fibrillation runs.  I suspect this may have been what was causing his palpitations.  I asked him to stop drinking ETOH and smoking, and he says that this has helped in terms of the palpitations.   - I would like him to continue to remain abstinent from ETOH.  - CHADSVASC 1 (congestive heart failure/cardiomyopathy).  He is now back on Eliquis.  I will repeat an  echo in 6 months and would consider stopping Eliquis if he is in NSR and EF is near normal.   3. HTN: BP has been controlled.  4. ETOH abuse: Imperative stop drinking given cardiomyopathy, elevated LFTs, and atrial fibrillation. He is having less palpitations since he cut back on ETOH.  5. Atrial flutter: Paroxysmal.  I was concerned for tachy-mediated cardiomyopathy, but EF not significantly improved in 1/19 after > 1 month in NSR. He had atrial flutter ablation in 6/19.   6. 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 HCV and HBV negative.  - Recheck LFTs today.    Signed, Loralie Champagne, MD  12/19/2018  [place] [address] Heart and [place] [address] [phone] (office) [phone] (fax)

## 2018-12-27 IMAGING — DX DG CHEST 2V
2 series · 2 of 2 positions shown · non-contrast
Comparison: Chest radiographs 01/28/2017 and earlier.

CLINICAL DATA: 41-year-old male with left chest pain onset this
afternoon.

EXAM:
CHEST - 2 VIEW

[chest pa]
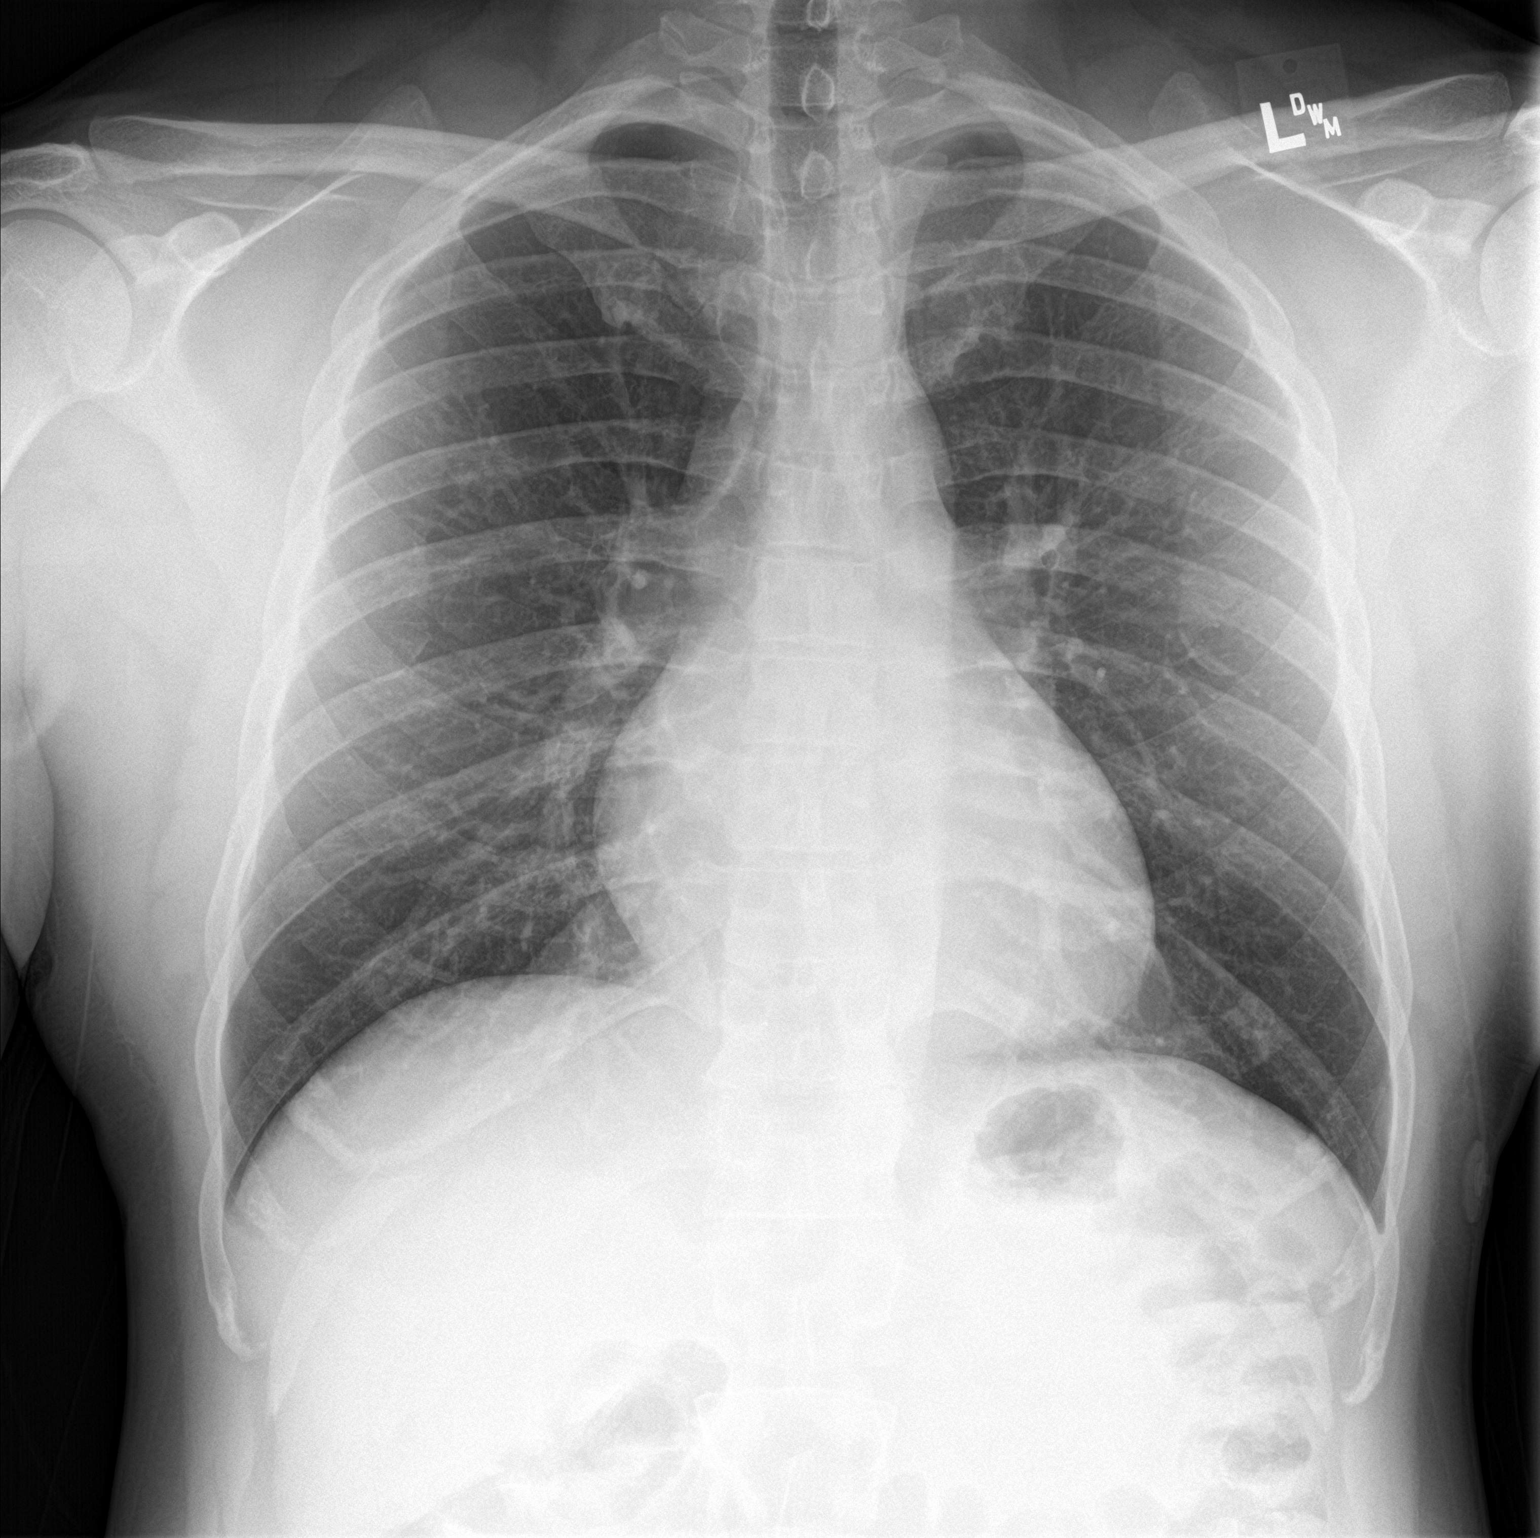

[chest lat]
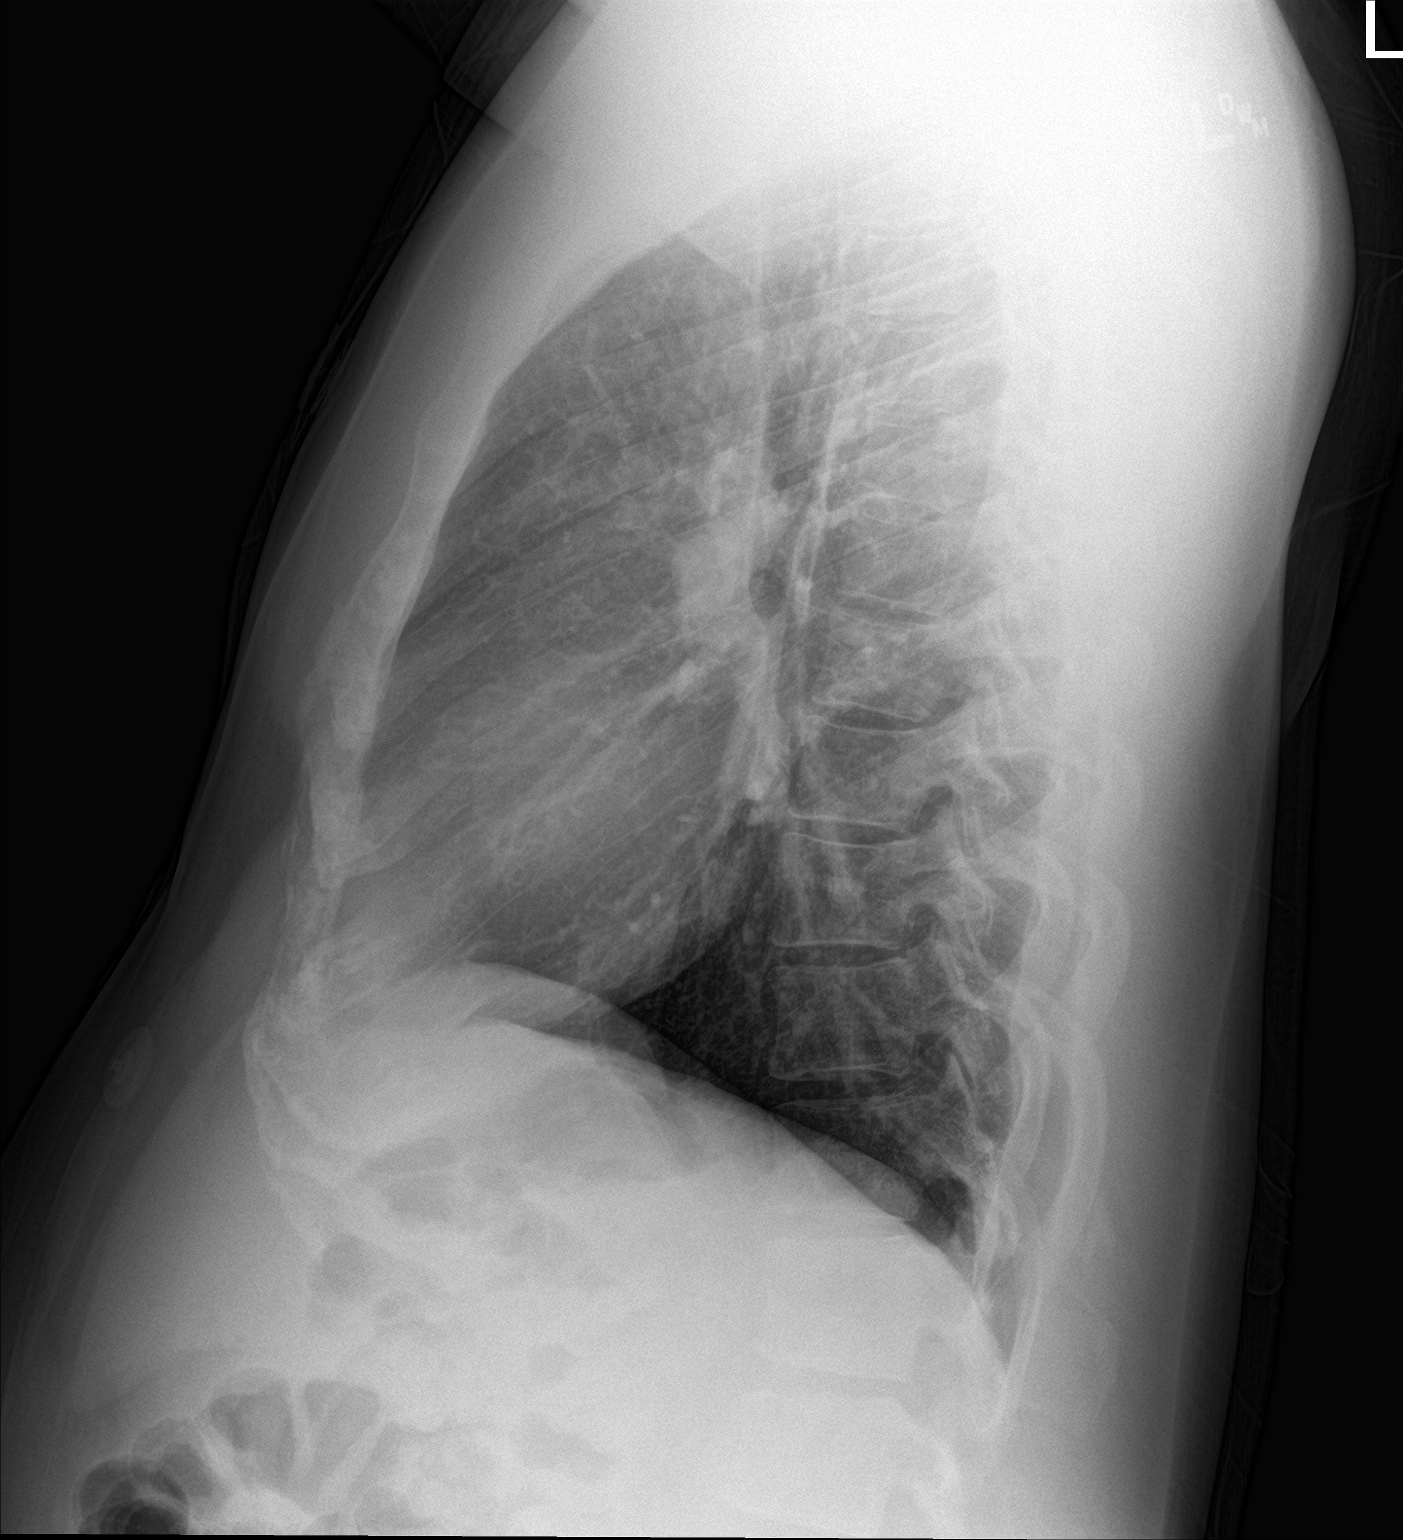

[2 of 2 positions shown; findings below may reference images not displayed]

FINDINGS: Lung volumes and mediastinal contours are within normal limits.
Visualized tracheal air column is within normal limits. Both lungs
appear clear. No pneumothorax or pleural effusion.

Mild reversal of thoracic kyphosis. No acute osseous abnormality
identified. Negative visible bowel gas pattern.
IMPRESSION: No cardiopulmonary abnormality.

## 2019-01-01 ENCOUNTER — Ambulatory Visit (HOSPITAL_COMMUNITY)
Admission: RE | Admit: 2019-01-01 | Discharge: 2019-01-01 | Disposition: A | Discharge status: Home / Self Care | Payer: Self-pay | Source: Ambulatory Visit | Attending: Cardiology | Admitting: Cardiology

## 2019-01-01 ENCOUNTER — Other Ambulatory Visit: Payer: Self-pay

## 2019-01-01 DIAGNOSIS — I5022 Chronic systolic (congestive) heart failure: Secondary | ICD-10-CM | POA: Insufficient documentation

## 2019-01-01 LAB — BASIC METABOLIC PANEL
Anion gap: 9 (ref 5–15)
BUN: 8 mg/dL (ref 6–20)
CO2: 26 mmol/L (ref 22–32)
Calcium: 9 mg/dL (ref 8.9–10.3)
Chloride: 104 mmol/L (ref 98–111)
Creatinine, Ser: 0.99 mg/dL (ref 0.61–1.24)
GFR calc Af Amer: >60 mL/min (ref >60)
GFR calc non Af Amer: >60 mL/min (ref >60)
Glucose, Bld: 108 mg/dL — ABNORMAL HIGH (ref 70–99)
Potassium: 4.4 mmol/L (ref 3.5–5.1)
Sodium: 139 mmol/L (ref 135–145)

## 2019-01-01 MED ORDER — CARVEDILOL 12.5 MG PO TABS: 18.7500 mg | ORAL_TABLET | Freq: Two times a day (BID) | ORAL | 3 refills | Status: DC

## 2019-01-01 MED ORDER — APIXABAN 5 MG PO TABS: 5.0000 mg | ORAL_TABLET | Freq: Two times a day (BID) | ORAL | 6 refills | Status: AC

## 2019-01-01 MED ORDER — MAGNESIUM OXIDE 400 MG PO CAPS: 400.0000 mg | ORAL_CAPSULE | Freq: Every day | ORAL | 3 refills | Status: DC

## 2019-01-01 MED ORDER — ATORVASTATIN CALCIUM 40 MG PO TABS: 40.0000 mg | ORAL_TABLET | Freq: Every day | ORAL | 3 refills | Status: AC

## 2019-01-01 MED ORDER — SPIRONOLACTONE 25 MG PO TABS: 25.0000 mg | ORAL_TABLET | Freq: Every day | ORAL | 6 refills | Status: AC

## 2019-01-01 NOTE — Addendum Note (Signed)
Encounter addended by: Harvie Junior, CMA on: 01/01/2019 1:03 PM  Actions taken: Pharmacy for encounter modified, Order list changed

## 2019-02-13 ENCOUNTER — Other Ambulatory Visit (HOSPITAL_COMMUNITY): Payer: Self-pay | Admitting: Cardiology

## 2019-03-09 ENCOUNTER — Other Ambulatory Visit (HOSPITAL_COMMUNITY): Payer: Self-pay | Admitting: Cardiology

## 2019-03-23 ENCOUNTER — Encounter (HOSPITAL_COMMUNITY): Payer: Self-pay | Admitting: Cardiology

## 2020-02-02 ENCOUNTER — Other Ambulatory Visit (HOSPITAL_COMMUNITY)
Admission: RE | Admit: 2020-02-02 | Discharge: 2020-02-02 | Disposition: A | Discharge status: Home / Self Care | Payer: Commercial Managed Care - PPO | Source: Ambulatory Visit | Attending: Family Medicine | Admitting: Family Medicine

## 2020-02-02 ENCOUNTER — Ambulatory Visit (INDEPENDENT_AMBULATORY_CARE_PROVIDER_SITE_OTHER): Payer: Commercial Managed Care - PPO | Admitting: Family Medicine

## 2020-02-02 ENCOUNTER — Other Ambulatory Visit: Payer: Self-pay

## 2020-02-02 ENCOUNTER — Encounter: Payer: Self-pay | Admitting: Family Medicine

## 2020-02-02 VITALS — BP 128/86 | HR 86 | Temp 98.4°F | Ht 72.0 in | Wt 177.1 lb

## 2020-02-02 DIAGNOSIS — Z114 Encounter for screening for human immunodeficiency virus [HIV]: Secondary | ICD-10-CM

## 2020-02-02 DIAGNOSIS — Z113 Encounter for screening for infections with a predominantly sexual mode of transmission: Secondary | ICD-10-CM | POA: Insufficient documentation

## 2020-02-02 NOTE — Patient Instructions (Signed)
Give [place] 5-6 business days to get the results of your labs back.   Practice safe sex.  If you want a vasectomy, let me know on MyChart.  Let us know if you need anything.

## 2020-02-02 NOTE — Progress Notes (Signed)
Chief Complaint  Patient presents with   Exposure to STD    Subjective: Patient is a 44 y.o. male here for STI screening.  Unprotected intercourse with a mistress 4 d ago most recently.  His wife found out and schedule him this appointment.  He is not having any skin lesions in the genitalia region or oral region, urinary complaints, discharge, or testicular pain.  He has no known history of herpes or syphilis.  He believes his mistress is pregnant.  She has not reported any concerns for any STIs.  Past Medical History:  Diagnosis Date   A-fib ([place])    CHF (congestive heart failure) ([facility])    Hyperlipidemia    Hypertension     Objective: BP 128/86 (BP Location: Left Arm, Patient Position: Sitting, Cuff Size: Normal)    Pulse 86    Temp 98.4 F (36.9 C) (Oral)    Ht 6' (1.829 m)    Wt 177 lb 2 oz (80.3 kg)    SpO2 97%    BMI 24.02 kg/m  General: Awake, appears stated age HEENT: MMM, no external lesions Heart: RRR Lungs: CTAB, no rales, wheezes or rhonchi. No accessory muscle use Psych: Age appropriate judgment and insight, normal affect and mood  Assessment and Plan: Screening for HIV (human immunodeficiency virus) - Plan: HIV Antibody (routine testing w rflx)  Routine screening for STI (sexually transmitted infection) - Plan: Urine cytology ancillary only([place])  Screen for HIV, check urine ancillary as well.  Will check for trichomonas, gonorrhea, and chlamydia.  Practice safe sex.  I did offer a referral to the urology team for vasectomy as he does not want kids any more.  He declined at this time but I stated this is a standing offer. Follow-up for physical at convenience. The patient voiced understanding and agreement to the plan.  [place], DO 02/02/20  3:13 PM

## 2020-02-03 LAB — URINE CYTOLOGY ANCILLARY ONLY
Chlamydia: NEGATIVE
Comment: NEGATIVE
Comment: NEGATIVE
Comment: NORMAL
Neisseria Gonorrhea: NEGATIVE
Trichomonas: NEGATIVE

## 2020-02-03 LAB — HIV ANTIBODY (ROUTINE TESTING W REFLEX): HIV 1&2 Ab, 4th Generation: NONREACTIVE

## 2020-04-25 ENCOUNTER — Telehealth: Payer: Self-pay | Admitting: Family Medicine

## 2020-04-25 NOTE — Telephone Encounter (Signed)
Good Morning Mallie Mussel,   Unfortunately Dr. Nani Ravens doesn't DOT Physicals.  Please let me know if you would like to see him something else or if you would like to cancel the appointment. For DOT Physical you may want to try [place] [address] [place] [id number] [phone]   This location also takes [facility].  Please let me know if this works for you   Sherri      Previous Messages  ----- Message -----    From:Samie Tomma Lightning.    Sent:04/25/2020 12:42 AM EST     GY:[id number] [facility] scheduled from [place] For: Esteban Kobashigawa. ([id number])  Visit Type: OFFICE VISIT (1004)   04/28/2020  8:00 AM 15 mins. Shelda Pal, DO [facility]   Patient Comments:  Dot physical     [address]  MyChart User Last Read On  Cobie Marcoux. 04/25/2020 11:39 AM       Good Morning Mallie Mussel,   Unfortunately Dr. Nani Ravens doesn't DOT Physicals.  Please let me know if you would like to see him something else or if you would like to cancel the appointment. For DOT Physical you may want to try [place] [address] [place] [id number] [phone]   This location also takes [facility].  Please let me know if this works for you   Sherri      Previous Messages  ----- Message -----    From:Arch Tomma Lightning.    Sent:04/25/2020 12:42 AM EST     PQ:ZRAQTMA [facility] scheduled from [place] For: Akshath Mccarey. ([id number])  Visit Type: OFFICE VISIT (1004)   04/28/2020  8:00 AM 15 mins. Shelda Pal, DO [facility]   Patient Comments:  Dot physical     [address]  MyChart User Last Read On  Kelyn Koskela. 04/25/2020 11:39 AM

## 2020-04-28 ENCOUNTER — Ambulatory Visit: Payer: Commercial Managed Care - PPO | Admitting: Family Medicine

## 2020-08-25 ENCOUNTER — Other Ambulatory Visit (HOSPITAL_COMMUNITY)
Admission: RE | Admit: 2020-08-25 | Discharge: 2020-08-25 | Disposition: A | Discharge status: Home / Self Care | Payer: Commercial Managed Care - PPO | Source: Ambulatory Visit | Attending: Family Medicine | Admitting: Family Medicine

## 2020-08-25 ENCOUNTER — Ambulatory Visit (INDEPENDENT_AMBULATORY_CARE_PROVIDER_SITE_OTHER): Payer: Commercial Managed Care - PPO | Admitting: Family Medicine

## 2020-08-25 ENCOUNTER — Encounter: Payer: Self-pay | Admitting: Family Medicine

## 2020-08-25 ENCOUNTER — Other Ambulatory Visit: Payer: Self-pay

## 2020-08-25 VITALS — BP 122/84 | HR 82 | Temp 98.2°F | Ht 72.0 in | Wt 175.4 lb

## 2020-08-25 DIAGNOSIS — Z113 Encounter for screening for infections with a predominantly sexual mode of transmission: Secondary | ICD-10-CM

## 2020-08-25 DIAGNOSIS — Z0000 Encounter for general adult medical examination without abnormal findings: Secondary | ICD-10-CM

## 2020-08-25 DIAGNOSIS — Z114 Encounter for screening for human immunodeficiency virus [HIV]: Secondary | ICD-10-CM | POA: Diagnosis not present

## 2020-08-25 DIAGNOSIS — I480 Paroxysmal atrial fibrillation: Secondary | ICD-10-CM

## 2020-08-25 DIAGNOSIS — I5022 Chronic systolic (congestive) heart failure: Secondary | ICD-10-CM | POA: Diagnosis not present

## 2020-08-25 LAB — COMPREHENSIVE METABOLIC PANEL
ALT: 81 U/L — ABNORMAL HIGH (ref 0–53)
AST: 50 U/L — ABNORMAL HIGH (ref 0–37)
Albumin: 4.5 g/dL (ref 3.5–5.2)
Alkaline Phosphatase: 109 U/L (ref 39–117)
BUN: 13 mg/dL (ref 6–23)
CO2: 27 mEq/L (ref 19–32)
Calcium: 9 mg/dL (ref 8.4–10.5)
Chloride: 104 mEq/L (ref 96–112)
Creatinine, Ser: 1.18 mg/dL (ref 0.40–1.50)
GFR: 75.56 mL/min (ref >60.00)
Glucose, Bld: 91 mg/dL (ref 70–99)
Potassium: 4.7 mEq/L (ref 3.5–5.1)
Sodium: 139 mEq/L (ref 135–145)
Total Bilirubin: 0.5 mg/dL (ref 0.2–1.2)
Total Protein: 7.3 g/dL (ref 6.0–8.3)

## 2020-08-25 LAB — LIPID PANEL
Cholesterol: 193 mg/dL (ref 0–200)
HDL: 53.1 mg/dL (ref >39.00)
LDL Cholesterol: 110 mg/dL — ABNORMAL HIGH (ref 0–99)
NonHDL: 139.97
Total CHOL/HDL Ratio: 4
Triglycerides: 148 mg/dL (ref 0.0–149.0)
VLDL: 29.6 mg/dL (ref 0.0–40.0)

## 2020-08-25 LAB — CBC
HCT: 50.9 % (ref 39.0–52.0)
Hemoglobin: 16.9 g/dL (ref 13.0–17.0)
MCHC: 33.2 g/dL (ref 30.0–36.0)
MCV: 91.9 fl (ref 78.0–100.0)
Platelets: 212 10*3/uL (ref 150.0–400.0)
RBC: 5.54 Mil/uL (ref 4.22–5.81)
RDW: 14.3 % (ref 11.5–15.5)
WBC: 5.7 10*3/uL (ref 4.0–10.5)

## 2020-08-25 NOTE — Patient Instructions (Signed)
Give us 2-3 business days to get the results of your labs back.   Keep the diet clean and stay active.  Let us know if you need anything. 

## 2020-08-25 NOTE — Progress Notes (Signed)
Chief Complaint  Patient presents with   Follow-up    Well Male Devin [name]. is here for a complete physical.   His last physical was >1 year ago.  Current diet: in general, diet fair   Current exercise: lift wts, cardio Weight trend: stable Fatigue out of ordinary? No. Seat belt? Yes.    Health maintenance Tetanus- Yes HIV- Yes Hep C- Yes  Past Medical History:  Diagnosis Date   A-fib ([place])    CHF (congestive heart failure) ([place])    Hyperlipidemia    Hypertension      Past Surgical History:  Procedure Laterality Date   A-FLUTTER ABLATION N/A 08/12/2017   Procedure: A-FLUTTER ABLATION;  Surgeon: Evans Lance, MD;  Location: [facility];  Service: Cardiovascular;  Laterality: N/A;   CARDIOVERSION N/A 01/27/2017   Procedure: CARDIOVERSION;  Surgeon: Sanda Klein, MD;  Location: [facility];  Service: Cardiovascular;  Laterality: N/A;   NO PAST SURGERIES     RIGHT/LEFT HEART CATH AND CORONARY ANGIOGRAPHY N/A 03/21/2017   Procedure: RIGHT/LEFT HEART CATH AND CORONARY ANGIOGRAPHY;  Surgeon: Larey Dresser, MD;  Location: [facility];  Service: Cardiovascular;  Laterality: N/A;   TEE WITHOUT CARDIOVERSION N/A 01/27/2017   Procedure: TRANSESOPHAGEAL ECHOCARDIOGRAM (TEE);  Surgeon: Sanda Klein, MD;  Location: [facility];  Service: Cardiovascular;  Laterality: N/A;    Medications  Current Outpatient Medications on File Prior to Visit  Medication Sig Dispense Refill   acetaminophen (TYLENOL) 500 MG tablet Take 1 tablet (500 mg total) by mouth every 6 (six) hours as needed for mild pain (or Fever >/= 101). 1 tablet 0   apixaban (ELIQUIS) 5 MG TABS tablet Take 1 tablet (5 mg total) by mouth 2 (two) times daily. 60 tablet 6   atorvastatin (LIPITOR) 40 MG tablet Take 1 tablet (40 mg total) by mouth daily. 90 tablet 3   carvedilol (COREG) 12.5 MG tablet TAKE 1.5 TABLETS (18.75 MG TOTAL) BY MOUTH 2 (TWO) TIMES DAILY WITH A MEAL. 270 tablet 2   ENTRESTO  97-103 MG TAKE 1 TABLET BY MOUTH TWICE DAILY 60 tablet 11   magnesium oxide (MAG-OX) 400 MG tablet TAKE 1 TABLET BY MOUTH EVERY DAY 90 tablet 3   spironolactone (ALDACTONE) 25 MG tablet Take 1 tablet (25 mg total) by mouth daily. 30 tablet 6   Allergies No Known Allergies  Family History Family History  Problem Relation Age of Onset   Diabetes Mother    Hypertension Mother    Hypertension Father    Hypertension Sister    Asthma Son    Cancer Maternal Grandfather    Cancer Paternal Grandmother        cancer   Heart attack Paternal Grandfather     Review of Systems: Constitutional: no fevers or chills Eye:  no recent significant change in vision Ear/Nose/Mouth/Throat:  Ears:  no hearing loss Nose/Mouth/Throat:  no complaints of nasal congestion, no sore throat Cardiovascular:  no chest pain Respiratory:  no shortness of breath Gastrointestinal:  no abdominal pain, no change in bowel habits GU:  Male: negative for dysuria, frequency, and incontinence Musculoskeletal/Extremities:  no pain of the joints Integumentary (Skin/Breast):  no abnormal skin lesions reported Neurologic:  no headaches Endocrine: No unexpected weight changes Hematologic/Lymphatic:  no night sweats  Exam BP 122/84   Pulse 82   Temp 98.2 F (36.8 C) (Oral)   Ht 6' (1.829 m)   Wt 175 lb 6 oz (79.5 kg)   SpO2 99%  BMI 23.79 kg/m  General:  well developed, well nourished, in no apparent distress Skin:  no significant moles, warts, or growths Head:  no masses, lesions, or tenderness Eyes:  pupils equal and round, sclera anicteric without injection Ears:  canals without lesions, TMs shiny without retraction, no obvious effusion, no erythema Nose:  nares patent, septum midline, mucosa normal Throat/Pharynx:  lips and gingiva without lesion; tongue and uvula midline; non-inflamed pharynx; no exudates or postnasal drainage Neck: neck supple without adenopathy, thyromegaly, or masses Lungs:  clear to  auscultation, breath sounds equal bilaterally, no respiratory distress Cardio:  regular rate and rhythm, no bruits, no LE edema Abdomen:  abdomen soft, nontender; bowel sounds normal; no masses or organomegaly Rectal: Deferred Musculoskeletal:  symmetrical muscle groups noted without atrophy or deformity Extremities:  no clubbing, cyanosis, or edema, no deformities, no skin discoloration Neuro:  gait normal; deep tendon reflexes normal and symmetric Psych: well oriented with normal range of affect and appropriate judgment/insight  Assessment and Plan  Well adult exam - Plan: CBC, Comprehensive metabolic panel, Lipid panel  Screening for HIV (human immunodeficiency virus) - Plan: HIV Antibody (routine testing w rflx)  Routine screening for STI (sexually transmitted infection) - Plan: Urine cytology ancillary only([place])  Chronic systolic heart failure (H[facility]), Chronic  Paroxysmal atrial fibrillation ([place]), Chronic   Well 45 y.o. male. Counseled on diet and exercise. Counseled on risks and benefits of prostate cancer screening with PSA. The patient agrees to forego screening.  Other orders as above. Follow up in 1 yr pending the above workup. The patient voiced understanding and agreement to the plan.  [place], DO 08/25/20 10:26 AM

## 2020-08-27 LAB — URINE CYTOLOGY ANCILLARY ONLY
Chlamydia: NEGATIVE
Comment: NEGATIVE
Comment: NEGATIVE
Comment: NORMAL
Neisseria Gonorrhea: NEGATIVE
Trichomonas: POSITIVE — AB

## 2020-08-28 ENCOUNTER — Other Ambulatory Visit: Payer: Self-pay | Admitting: Family Medicine

## 2020-08-28 DIAGNOSIS — R7989 Other specified abnormal findings of blood chemistry: Secondary | ICD-10-CM

## 2020-08-28 LAB — HIV ANTIBODY (ROUTINE TESTING W REFLEX): HIV 1&2 Ab, 4th Generation: NONREACTIVE

## 2020-08-28 MED ORDER — METRONIDAZOLE 500 MG PO TABS: ORAL_TABLET | ORAL | 0 refills | Status: DC

## 2020-09-11 ENCOUNTER — Other Ambulatory Visit: Payer: Commercial Managed Care - PPO

## 2020-09-15 ENCOUNTER — Other Ambulatory Visit: Payer: Self-pay | Admitting: Family Medicine

## 2020-09-15 MED ORDER — METRONIDAZOLE 500 MG PO TABS: ORAL_TABLET | ORAL | 0 refills | Status: DC

## 2020-09-18 ENCOUNTER — Other Ambulatory Visit: Payer: Commercial Managed Care - PPO

## 2020-12-07 ENCOUNTER — Telehealth: Payer: Commercial Managed Care - PPO | Admitting: Physician Assistant

## 2020-12-07 DIAGNOSIS — L03211 Cellulitis of face: Secondary | ICD-10-CM | POA: Diagnosis not present

## 2020-12-07 NOTE — Patient Instructions (Signed)
  Devin [name]., thank you for joining Leeanne Rio, PA-C for today's virtual visit.  While this provider is not your primary care provider (PCP), if your PCP is located in our provider database this encounter information will be shared with them immediately following your visit.  Consent: (Patient) Devin [name]. provided verbal consent for this virtual visit at the beginning of the encounter.  Current Medications:  Current Outpatient Medications:    acetaminophen (TYLENOL) 500 MG tablet, Take 1 tablet (500 mg total) by mouth every 6 (six) hours as needed for mild pain (or Fever >/= 101)., Disp: 1 tablet, Rfl: 0   apixaban (ELIQUIS) 5 MG TABS tablet, Take 1 tablet (5 mg total) by mouth 2 (two) times daily., Disp: 60 tablet, Rfl: 6   atorvastatin (LIPITOR) 40 MG tablet, Take 1 tablet (40 mg total) by mouth daily., Disp: 90 tablet, Rfl: 3   carvedilol (COREG) 12.5 MG tablet, TAKE 1.5 TABLETS (18.75 MG TOTAL) BY MOUTH 2 (TWO) TIMES DAILY WITH A MEAL., Disp: 270 tablet, Rfl: 2   ENTRESTO 97-103 MG, TAKE 1 TABLET BY MOUTH TWICE DAILY, Disp: 60 tablet, Rfl: 11   magnesium oxide (MAG-OX) 400 MG tablet, TAKE 1 TABLET BY MOUTH EVERY DAY, Disp: 90 tablet, Rfl: 3   metroNIDAZOLE (FLAGYL) 500 MG tablet, Take 4 tabs (2 g) once., Disp: 4 tablet, Rfl: 0   spironolactone (ALDACTONE) 25 MG tablet, Take 1 tablet (25 mg total) by mouth daily., Disp: 30 tablet, Rfl: 6   Medications ordered in this encounter:  No orders of the defined types were placed in this encounter.    *If you need refills on other medications prior to your next appointment, please contact your pharmacy*  Follow-Up: Call back or seek an in-person evaluation if the symptoms worsen or if the condition fails to improve as anticipated.  Other Instructions Please keep skin clean and dry. Apply warm compresses for 10-15 minutes a couple of times per day as discussed. Take the antibiotic given to you at Urgent Care as  directed. You can use Tylenol if needed for pain.  Symptoms should improve a good bit within 48 hours. At that point if no improvement, or you note new or worsening symptoms, you need a repeat assessment.    If you have been instructed to have an in-person evaluation today at a local Urgent Care facility, please use the link below. It will take you to a list of all of our available [facility], including address, phone number and hours of operation. Please do not delay care.  [facility]  If you or a family member do not have a primary care provider, use the link below to schedule a visit and establish care. When you choose a [facility] primary care physician or advanced practice provider, you gain a long-term partner in health. Find a Primary Care Provider  Learn more about [facility] in-office and virtual care options: [facility]

## 2020-12-07 NOTE — Progress Notes (Signed)
Virtual Visit Consent   Devin Gourd., you are scheduled for a virtual visit with a [facility] provider today.     Just as with appointments in the office, your consent must be obtained to participate.  Your consent will be active for this visit and any virtual visit you may have with one of our providers in the next 365 days.     If you have a MyChart account, a copy of this consent can be sent to you electronically.  All virtual visits are billed to your insurance company just like a traditional visit in the office.    As this is a virtual visit, video technology does not allow for your provider to perform a traditional examination.  This may limit your provider's ability to fully assess your condition.  If your provider identifies any concerns that need to be evaluated in person or the need to arrange testing (such as labs, EKG, etc.), we will make arrangements to do so.     Although advances in technology are sophisticated, we cannot ensure that it will always work on either your end or our end.  If the connection with a video visit is poor, the visit may have to be switched to a telephone visit.  With either a video or telephone visit, we are not always able to ensure that we have a secure connection.     I need to obtain your verbal consent now.   Are you willing to proceed with your visit today?    Devin Gourd. has provided verbal consent on 12/07/2020 for a virtual visit (video or telephone).   Leeanne Rio, [place]   Date: 12/07/2020 11:35 AM   Virtual Visit via Video Note   I, Leeanne Rio, connected with  Devin [name].  ([id number], 08-26-1975) on 12/07/20 at 11:30 AM EDT by a video-enabled telemedicine application and verified that I am speaking with the correct person using two identifiers.  Location: Patient: Virtual Visit Location Patient: Home Provider: Virtual Visit Location Provider: Home Office   I discussed the limitations of evaluation and  management by telemedicine and the availability of in person appointments. The patient expressed understanding and agreed to proceed.    History of Present Illness: Devin [name]. is a 45 y.o. who identifies as a male who was assigned male at birth, and is being seen today for further questions about an acute issue. Notes noting an ingrown hair of chin that has become tender and swollen over past couple of days. Popped the area with some noted purulent drainage. The area got a bit better but then developed redness, warmth and tenderness of surrounding skin. As such when to a local Urgent Care this morning and was diagnosed with mild cellulitis. Started on antibiotics. He is in the car waiting for the prescription to be filled. He was just unsure if he should try squeezing on the area further. Denies fever, chills. Notes the initial area of concern is hard and tender. Denies any noted fluctuance.  HPI: HPI  Problems:  Patient Active Problem List   Diagnosis Date Noted   Typical atrial flutter ([place]) 85/27/7824   Acute systolic CHF (congestive heart failure) ([place]) 01/28/2017   Well adult exam 02/02/2016   Elevated LFTs 12/16/2014   Mixed hyperlipidemia 12/16/2014   Essential hypertension 12/15/2014    Allergies: No Known Allergies Medications:  Current Outpatient Medications:    acetaminophen (TYLENOL) 500 MG tablet, Take 1 tablet (500 mg total) by  mouth every 6 (six) hours as needed for mild pain (or Fever >/= 101)., Disp: 1 tablet, Rfl: 0   apixaban (ELIQUIS) 5 MG TABS tablet, Take 1 tablet (5 mg total) by mouth 2 (two) times daily. (Patient not taking: Reported on 12/07/2020), Disp: 60 tablet, Rfl: 6   atorvastatin (LIPITOR) 40 MG tablet, Take 1 tablet (40 mg total) by mouth daily. (Patient not taking: Reported on 12/07/2020), Disp: 90 tablet, Rfl: 3   carvedilol (COREG) 12.5 MG tablet, TAKE 1.5 TABLETS (18.75 MG TOTAL) BY MOUTH 2 (TWO) TIMES DAILY WITH A MEAL. (Patient not taking: Reported  on 12/07/2020), Disp: 270 tablet, Rfl: 2   ENTRESTO 97-103 MG, TAKE 1 TABLET BY MOUTH TWICE DAILY (Patient not taking: Reported on 12/07/2020), Disp: 60 tablet, Rfl: 11   magnesium oxide (MAG-OX) 400 MG tablet, TAKE 1 TABLET BY MOUTH EVERY DAY, Disp: 90 tablet, Rfl: 3   spironolactone (ALDACTONE) 25 MG tablet, Take 1 tablet (25 mg total) by mouth daily. (Patient not taking: Reported on 12/07/2020), Disp: 30 tablet, Rfl: 6  Observations/Objective: Patient is well-developed, well-nourished in no acute distress.  Resting comfortably in parked car.   Head is normocephalic, atraumatic.  No labored breathing. Speech is clear and coherent with logical content.  Patient is alert and oriented at baseline.   Assessment and Plan: 1. Cellulitis of face Was seen at Urgent Care this morning and started on antibiotic. Small isolated area of redness of L side of chin. He notes hardness to palpation. No alarm signs.symptoms. Reassurance given. Recommend he take antibiotics as prescribed and continue care directed by UC provider. Sent him some additional instructions on skin care. Follow-up in person for any non-resolving, new or worsening symptoms despite treatment  Follow Up Instructions: I discussed the assessment and treatment plan with the patient. The patient was provided an opportunity to ask questions and all were answered. The patient agreed with the plan and demonstrated an understanding of the instructions.  A copy of instructions were sent to the patient via MyChart unless otherwise noted below.   The patient was advised to call back or seek an in-person evaluation if the symptoms worsen or if the condition fails to improve as anticipated.  Time:  I spent 10 minutes with the patient via telehealth technology discussing the above problems/concerns.    Leeanne Rio, PA-C
